# Patient Record
Sex: Female | Born: 1935 | Race: White | Hispanic: No | Smoking: Never smoker
Health system: Southern US, Community
[De-identification: ages and names within clinical notes are randomized; demographics above are authoritative.]

## PROBLEM LIST (undated history)

## (undated) DIAGNOSIS — R413 Other amnesia: Secondary | ICD-10-CM

## (undated) DIAGNOSIS — F419 Anxiety disorder, unspecified: Secondary | ICD-10-CM

## (undated) DIAGNOSIS — F0390 Unspecified dementia without behavioral disturbance: Secondary | ICD-10-CM

## (undated) DIAGNOSIS — E079 Disorder of thyroid, unspecified: Secondary | ICD-10-CM

## (undated) HISTORY — PX: TONSILLECTOMY: SUR1361

## (undated) HISTORY — DX: Unspecified dementia, unspecified severity, without behavioral disturbance, psychotic disturbance, mood disturbance, and anxiety: F03.90

## (undated) HISTORY — PX: URETHRAL SLING: SHX2621

## (undated) HISTORY — DX: Anxiety disorder, unspecified: F41.9

## (undated) HISTORY — PX: ABDOMINAL HYSTERECTOMY: SHX81

## (undated) HISTORY — DX: Other amnesia: R41.3

## (undated) HISTORY — PX: FOOT SURGERY: SHX648

## (undated) HISTORY — PX: KNEE SURGERY: SHX244

## (undated) HISTORY — PX: APPENDECTOMY: SHX54

## (undated) HISTORY — PX: ANTERIOR AND POSTERIOR VAGINAL REPAIR: SUR5

---

## 2013-04-27 ENCOUNTER — Emergency Department (HOSPITAL_BASED_OUTPATIENT_CLINIC_OR_DEPARTMENT_OTHER)
Admission: EM | Admit: 2013-04-27 | Discharge: 2013-04-27 | Disposition: A | Discharge status: Home / Self Care | Payer: PRIVATE HEALTH INSURANCE | Attending: Emergency Medicine | Admitting: Emergency Medicine

## 2013-04-27 ENCOUNTER — Encounter (HOSPITAL_BASED_OUTPATIENT_CLINIC_OR_DEPARTMENT_OTHER): Payer: Self-pay | Admitting: Emergency Medicine

## 2013-04-27 DIAGNOSIS — Y929 Unspecified place or not applicable: Secondary | ICD-10-CM | POA: Insufficient documentation

## 2013-04-27 DIAGNOSIS — Z79899 Other long term (current) drug therapy: Secondary | ICD-10-CM | POA: Insufficient documentation

## 2013-04-27 DIAGNOSIS — E079 Disorder of thyroid, unspecified: Secondary | ICD-10-CM | POA: Insufficient documentation

## 2013-04-27 DIAGNOSIS — S01409A Unspecified open wound of unspecified cheek and temporomandibular area, initial encounter: Secondary | ICD-10-CM | POA: Insufficient documentation

## 2013-04-27 DIAGNOSIS — W1809XA Striking against other object with subsequent fall, initial encounter: Secondary | ICD-10-CM | POA: Insufficient documentation

## 2013-04-27 DIAGNOSIS — W010XXA Fall on same level from slipping, tripping and stumbling without subsequent striking against object, initial encounter: Secondary | ICD-10-CM | POA: Insufficient documentation

## 2013-04-27 DIAGNOSIS — IMO0002 Reserved for concepts with insufficient information to code with codable children: Secondary | ICD-10-CM | POA: Insufficient documentation

## 2013-04-27 DIAGNOSIS — S01412A Laceration without foreign body of left cheek and temporomandibular area, initial encounter: Secondary | ICD-10-CM

## 2013-04-27 DIAGNOSIS — Y939 Activity, unspecified: Secondary | ICD-10-CM | POA: Insufficient documentation

## 2013-04-27 HISTORY — DX: Disorder of thyroid, unspecified: E07.9

## 2013-04-27 NOTE — ED Notes (Signed)
Tripped Fell  Hit left cheek on wall  approx 1 inch lac  bleeding controlled  No loc

## 2013-04-27 NOTE — ED Notes (Signed)
Tripped and fell. Facial laceration to her left cheek.

## 2013-04-27 NOTE — ED Provider Notes (Signed)
CSN: [id number]     Arrival date & time 04/27/13  1758 History   None    Chief Complaint  Patient presents with  . Fall  . Facial Laceration   (Consider location/radiation/quality/duration/timing/severity/associated sxs/prior Treatment) Patient is a 80 y.o. female presenting with fall. The history is provided by the patient. No language interpreter was used.  Fall This is a new problem. The current episode started today. The problem occurs constantly. Pertinent negatives include no neck pain. Nothing aggravates the symptoms. She has tried nothing for the symptoms. The treatment provided no relief.  Pt reports she tripped and fell and hit her left cheek.  No loc.  No blood thinners.  Pt his cheek and left knee  Past Medical History  Diagnosis Date  . Thyroid disease    Past Surgical History  Procedure Laterality Date  . Abdominal hysterectomy    . Tonsillectomy     No family history on file. History  Substance Use Topics  . Smoking status: Never Smoker   . Smokeless tobacco: Not on file  . Alcohol Use: No   OB History   Grav Para Term Preterm Abortions TAB SAB Ect Mult Living                 Review of Systems  Musculoskeletal: Negative for neck pain.  All other systems reviewed and are negative.    Allergies  Review of patient's allergies indicates no known allergies.  Home Medications   Current Outpatient Rx  Name  Route  Sig  Dispense  Refill  . Levothyroxine Sodium (SYNTHROID PO)   Oral   Take by mouth.          BP 170/74  Pulse 70  Temp(Src) 98.2 F (36.8 C) (Oral)  Resp 18  Ht 5\' 8"  (1.727 m)  Wt 165 lb (74.844 kg)  BMI 25.09 kg/m2  SpO2 100% Physical Exam  Nursing note and vitals reviewed. Constitutional: She is oriented to person, place, and time. She appears well-developed and well-nourished.  HENT:  Head: Normocephalic.  Eyes: EOM are normal.  Neck: Normal range of motion.  Pulmonary/Chest: Effort normal.  Abdominal: She exhibits no  distension.  Musculoskeletal:  anrasion left knee superficial  Neurological: She is alert and oriented to person, place, and time.  Skin:  1.5 cm laceration left cheek  Psychiatric: She has a normal mood and affect.    ED Course  LACERATION REPAIR Date/Time: 04/28/2013 1:02 PM Performed by: Elson Areas Authorized by: Elson Areas Consent: Verbal consent not obtained. Risks and benefits: risks, benefits and alternatives were discussed Consent given by: patient Required items: required blood products, implants, devices, and special equipment available Patient identity confirmed: verbally with patient Body area: head/neck Laceration length: 1.5 cm Foreign bodies: no foreign bodies Tendon involvement: none Nerve involvement: none Vascular damage: no Anesthesia: local infiltration Local anesthetic: lidocaine 2% without epinephrine Preparation: Patient was prepped and draped in the usual sterile fashion. Irrigation solution: saline Amount of cleaning: standard Debridement: none Degree of undermining: none Skin closure: 6-0 nylon and 6-0 Prolene Number of sutures: 4 Technique: simple Approximation: loose Approximation difficulty: simple Patient tolerance: Patient tolerated the procedure well with no immediate complications.   (including critical care time) Labs Review Labs Reviewed - No data to display Imaging Review No results found.  EKG Interpretation   None       MDM   1. Laceration of left cheek, initial encounter    Suture removal in 5 days  Lonia SkinnerLeslie K Appleton CitySofia, PA-C 04/28/13 1304

## 2013-04-27 NOTE — Discharge Instructions (Signed)
Facial Laceration  A facial laceration is a cut on the face. Lacerations usually heal quickly, but they need special care to reduce scarring. It will take 1 to 2 years for the scar to lose its redness and to heal completely.  TREATMENT   Some facial lacerations may not require closure. Some lacerations may not be able to be closed due to an increased risk of infection. It is important to see your caregiver as soon as possible after an injury to minimize the risk of infection and to maximize the opportunity for successful closure.  If closure is appropriate, pain medicines may be given, if needed. The wound will be cleaned to help prevent infection. Your caregiver will use stitches (sutures), staples, wound glue (adhesive), or skin adhesive strips to repair the laceration. These tools bring the skin edges together to allow for faster healing and a better cosmetic outcome. However, all wounds will heal with a scar.   Once the wound has healed, scarring can be minimized by covering the wound with sunscreen during the day for 1 full year. Use a sunscreen with an SPF of at least 30. Sunscreen helps to reduce the pigment that will form in the scar. When applying sunscreen to a completely healed wound, massage the scar for a few minutes to help reduce the appearance of the scar. Use circular motions with your fingertips, on and around the scar. Do not massage a healing wound.  HOME CARE INSTRUCTIONS  For sutures:   Keep the wound clean and dry.   If you were given a bandage (dressing), you should change it at least once a day. Also change the dressing if it becomes wet or dirty, or as directed by your caregiver.   Wash the wound with soap and water 2 times a day. Rinse the wound off with water to remove all soap. Pat the wound dry with a clean towel.   After cleaning, apply a thin layer of the antibiotic ointment recommended by your caregiver. This will help prevent infection and keep the dressing from sticking.   You  may shower as usual after the first 24 hours. Do not soak the wound in water until the sutures are removed.   Only take over-the-counter or prescription medicines for pain, discomfort, or fever as directed by your caregiver.   Get your sutures removed as directed by your caregiver. With facial lacerations, sutures should usually be taken out after 4 to 5 days to avoid stitch marks.   Wait a few days after your sutures are removed before applying makeup.  For skin adhesive strips:   Keep the wound clean and dry.   Do not get the skin adhesive strips wet. You may bathe carefully, using caution to keep the wound dry.   If the wound gets wet, pat it dry with a clean towel.   Skin adhesive strips will fall off on their own. You may trim the strips as the wound heals. Do not remove skin adhesive strips that are still stuck to the wound. They will fall off in time.  For wound adhesive:   You may briefly wet your wound in the shower or bath. Do not soak or scrub the wound. Do not swim. Avoid periods of heavy perspiration until the skin adhesive has fallen off on its own. After showering or bathing, gently pat the wound dry with a clean towel.   Do not apply liquid medicine, cream medicine, ointment medicine, or makeup to your wound while the   wound, be careful not to apply tape directly over the skin adhesive. This may cause the adhesive to be pulled off before the wound is healed. °· Avoid prolonged exposure to sunlight or tanning lamps while the skin adhesive is in place. Exposure to ultraviolet light in the first year will darken the scar. °· The skin adhesive will usually remain in place for 5 to 10 days, then naturally fall off the skin. Do not pick at the adhesive film. °You may need a tetanus shot if: °· You cannot remember when you had your last tetanus shot. °· You have never had a tetanus  shot. °If you get a tetanus shot, your arm may swell, get red, and feel warm to the touch. This is common and not a problem. If you need a tetanus shot and you choose not to have one, there is a rare chance of getting tetanus. Sickness from tetanus can be serious. °SEEK IMMEDIATE MEDICAL CARE IF: °· You develop redness, pain, or swelling around the wound. °· There is yellowish-white fluid (pus) coming from the wound. °· You develop chills or a fever. °MAKE SURE YOU: °· Understand these instructions. °· Will watch your condition. °· Will get help right away if you are not doing well or get worse. °Document Released: 05/16/2004 Document Revised: 07/01/2011 Document Reviewed: 11/19/2012 °ExitCare® Patient Information ©2014 [employer]. ° °

## 2013-05-02 ENCOUNTER — Encounter (HOSPITAL_BASED_OUTPATIENT_CLINIC_OR_DEPARTMENT_OTHER): Payer: Self-pay | Admitting: Emergency Medicine

## 2013-05-02 ENCOUNTER — Emergency Department (HOSPITAL_BASED_OUTPATIENT_CLINIC_OR_DEPARTMENT_OTHER)
Admission: EM | Admit: 2013-05-02 | Discharge: 2013-05-02 | Disposition: A | Discharge status: Home / Self Care | Payer: PRIVATE HEALTH INSURANCE | Attending: Emergency Medicine | Admitting: Emergency Medicine

## 2013-05-02 DIAGNOSIS — Z4802 Encounter for removal of sutures: Secondary | ICD-10-CM | POA: Insufficient documentation

## 2013-05-02 DIAGNOSIS — E079 Disorder of thyroid, unspecified: Secondary | ICD-10-CM | POA: Insufficient documentation

## 2013-05-02 DIAGNOSIS — Z79899 Other long term (current) drug therapy: Secondary | ICD-10-CM | POA: Insufficient documentation

## 2013-05-02 NOTE — ED Notes (Signed)
Patient here for suture removal from left cheek, in place x 5 days

## 2013-05-02 NOTE — Discharge Instructions (Signed)
Suture Removal, Care After Refer to this sheet in the next few weeks. These instructions provide you with information on caring for yourself after your procedure. Your health care provider may also give you more specific instructions. Your treatment has been planned according to current medical practices, but problems sometimes occur. Call your health care provider if you have any problems or questions after your procedure. WHAT TO EXPECT AFTER THE PROCEDURE After your stitches (sutures) are removed, it is typical to have the following:  Some discomfort and swelling in the wound area.  Slight redness in the area. HOME CARE INSTRUCTIONS   If you have skin adhesive strips over the wound area, do not take the strips off. They will fall off on their own in a few days. If the strips remain in place after 14 days, you may remove them.  Change any bandages (dressings) at least once a day or as directed by your health care provider. If the bandage sticks, soak it off with warm, soapy water.  Apply cream or ointment only as directed by your health care provider. If using cream or ointment, wash the area with soap and water 2 times a day to remove all the cream or ointment. Rinse off the soap and pat the area dry with a clean towel.  Keep the wound area dry and clean. If the bandage becomes wet or dirty, or if it develops a bad smell, change it as soon as possible.  Continue to protect the wound from injury.  Use sunscreen when out in the sun. New scars become sunburned easily. SEEK MEDICAL CARE IF:  You have increasing redness, swelling, or pain in the wound.  You see pus coming from the wound.  You have a fever.  You notice a bad smell coming from the wound or dressing.  Your wound breaks open (edges not staying together). Document Released: 01/01/2001 Document Revised: 01/27/2013 Document Reviewed: 11/18/2012 ExitCare Patient Information 2014 [employer].  

## 2013-05-02 NOTE — ED Provider Notes (Signed)
CSN: [id number]     Arrival date & time 05/02/13  1513 History   First MD Initiated Contact with Patient 05/02/13 1603     Chief Complaint  Patient presents with  . Suture / Staple Removal   (Consider location/radiation/quality/duration/timing/severity/associated sxs/prior Treatment) HPI Here for a planned suture removal left cheek no problems no redness no pus no significant pain no fever. Past Medical History  Diagnosis Date  . Thyroid disease    Past Surgical History  Procedure Laterality Date  . Abdominal hysterectomy    . Tonsillectomy     No family history on file. History  Substance Use Topics  . Smoking status: Never Smoker   . Smokeless tobacco: Not on file  . Alcohol Use: No   OB History   Grav Para Term Preterm Abortions TAB SAB Ect Mult Living                 Review of Systems See HPI. Allergies  Review of patient's allergies indicates no known allergies.  Home Medications   Current Outpatient Rx  Name  Route  Sig  Dispense  Refill  . Levothyroxine Sodium (SYNTHROID PO)   Oral   Take by mouth.          BP 144/65  Pulse 64  Temp(Src) 98 F (36.7 C)  Resp 16  SpO2 100% Physical Exam Awake alert normal speech vital signs reviewed Cervical spine nontender Left cheek wound is healed well with no erythema no fluctuance no significant tenderness no dehiscence no purulent drainage ED Course  Procedures (including critical care time) Labs Review Labs Reviewed - No data to display Imaging Review No results found.  EKG Interpretation   None       MDM   1. Visit for suture removal        Hurman HornJohn M Dakotah Orrego, MD 05/03/13 2141

## 2013-05-08 NOTE — ED Provider Notes (Signed)
Medical screening examination/treatment/procedure(s) were performed by non-physician practitioner and as supervising physician I was immediately available for consultation/collaboration.  EKG Interpretation   None         Ignace Mandigo, MD 05/08/13 1103 

## 2018-03-03 ENCOUNTER — Non-Acute Institutional Stay (SKILLED_NURSING_FACILITY): Payer: Medicare Other | Admitting: Internal Medicine

## 2018-03-03 ENCOUNTER — Encounter: Payer: Self-pay | Admitting: Internal Medicine

## 2018-03-03 DIAGNOSIS — Z8673 Personal history of transient ischemic attack (TIA), and cerebral infarction without residual deficits: Secondary | ICD-10-CM

## 2018-03-03 DIAGNOSIS — F0281 Dementia in other diseases classified elsewhere with behavioral disturbance: Secondary | ICD-10-CM

## 2018-03-03 DIAGNOSIS — E034 Atrophy of thyroid (acquired): Secondary | ICD-10-CM | POA: Diagnosis not present

## 2018-03-03 DIAGNOSIS — I10 Essential (primary) hypertension: Secondary | ICD-10-CM | POA: Diagnosis not present

## 2018-03-03 DIAGNOSIS — G301 Alzheimer's disease with late onset: Secondary | ICD-10-CM | POA: Diagnosis not present

## 2018-03-03 DIAGNOSIS — L89893 Pressure ulcer of other site, stage 3: Secondary | ICD-10-CM

## 2018-03-03 DIAGNOSIS — L89890 Pressure ulcer of other site, unstageable: Secondary | ICD-10-CM

## 2018-03-03 DIAGNOSIS — M6282 Rhabdomyolysis: Secondary | ICD-10-CM

## 2018-03-03 NOTE — Progress Notes (Signed)
:  [facility] Room Number: [phone] Place of Service:  SNF (31)  Randon Goldsmithnne D. Lyn HollingsheadAlexander, MD  Extended Emergency Contact Information Primary Emergency Contact: Carter,Sarah Address: [address]          [place], [place] [id number] [place] Home Phone: [phone] Relation: Daughter     Allergies: Nitrofurantoin  Chief Complaint  Patient presents with  . New Admit To SNF    [facility]    HPI: Patient is 84 y.o. female with dementia, hypothyroidism, anxiety, who lives alone with daughter living next door.  For a week prior to admission she had not been very active sitting mostly in her recliner feeling weak and tired.  Evening of admission patient noted that her recliner was wet with her with urine and mother was somewhat disoriented.  EMS was called, gave her my 5 mg of Haldol IM and she was brought to the ED.  In the ED she was noted to have a low-grade fever 100.3 was hemodynamically stable routine lab work unremarkable except for mild troponin elevation mild leukocytosis and CK significantly elevated at 1966, UA negative for UTI chest x-ray with cardiomegaly and no acute.  CT of head showed possible subacute infarct in the left occipital region, chronic atrophy and small vessel ischemic changes.  Patient received aspirin, IV fluids.  Patient was admitted to [facility] from 11/5[facility]11 for rhabdomyolysis which was treated with IV fluids in which resolved.  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  Also noted right fifth metatarsal head pressure injury unstable and left fifth metatarsal head pressure injury stage III.  Wound care was consulted.  Patient was also noted to have hypertension and blood pressure was high so patient was started on low-dose Norvasc.  Patient is admitted to skilled nursing facility for OT/PT.  While at skilled nursing facility patient  will be followed for hypothyroidism treated with Synthroid, history of CVA treated with aspirin and glaucoma treated with Lumigan drops.  Past Medical History:  Diagnosis Date  . Anxiety   . Dementia (HCC)   . Memory loss   . Thyroid disease     Past Surgical History:  Procedure Laterality Date  . ABDOMINAL HYSTERECTOMY    . ABDOMINAL HYSTERECTOMY    . ANTERIOR AND POSTERIOR VAGINAL REPAIR    . APPENDECTOMY    . FOOT SURGERY    . KNEE SURGERY    . TONSILLECTOMY    . URETHRAL SLING      Allergies as of 03/03/2018      Reactions   Nitrofurantoin       Medication List        Accurate as of 03/03/18 12:53 PM. Always use your most recent med list.          ALPRAZolam 0.25 MG tablet Commonly known as:  XANAX Take 0.25 mg by mouth 2 (two) times daily as needed for anxiety.   amLODipine 2.5 MG tablet Commonly known as:  NORVASC Take 2.5 mg by mouth daily.   aspirin 325 MG tablet Take 325 mg by mouth daily.   bimatoprost 0.01 % Soln Commonly known as:  LUMIGAN Place 1 drop into both eyes at bedtime.   donepezil 10 MG tablet Commonly known as:  ARICEPT Take 10 mg by mouth at bedtime.   QUEtiapine 50 MG tablet Commonly known as:  SEROQUEL Take 50 mg by mouth  at bedtime.   SYNTHROID PO Take 50 mcg by mouth. Take 1 tablet (50 mcg total) by moth daily at 0600       No orders of the defined types were placed in this encounter.    There is no immunization history on file for this patient.  Social History   Tobacco Use  . Smoking status: Never Smoker  Substance Use Topics  . Alcohol use: No    Family history is   Family History  Problem Relation Age of Onset  . Hypertension Mother   . Cancer Mother   . Stroke Father   . Hypertension Father       Review of Systems  DATA OBTAINED: from patient, nurse GENERAL:  no fevers, fatigue, appetite changes SKIN: No itching, or rash EYES: No eye pain, redness, discharge EARS: No earache, tinnitus,  change in hearing NOSE: No congestion, drainage or bleeding  MOUTH/THROAT: No mouth or tooth pain, No sore throat RESPIRATORY: No cough, wheezing, SOB CARDIAC: No chest pain, palpitations, lower extremity edema  GI: No abdominal pain, No N/V/D or constipation, No heartburn or reflux  GU: No dysuria, frequency or urgency, or incontinence  MUSCULOSKELETAL: No unrelieved bone/joint pain NEUROLOGIC: No headache, dizziness or focal weakness PSYCHIATRIC: No c/o anxiety or sadness   Vitals:   03/03/18 1249  BP: (!) 146/70  Pulse: 79  Resp: 18  Temp: 98.1 F (36.7 C)    SpO2 Readings from Last 1 Encounters:  05/02/13 100%   Body mass index is 23.14 kg/m.     Physical Exam  GENERAL APPEARANCE: Alert, conversant,  No acute distress.  SKIN: No diaphoresis rash HEAD: Normocephalic, atraumatic  EYES: Conjunctiva/lids clear. Pupils round, reactive. EOMs intact.  EARS: External exam WNL, canals clear. Hearing grossly normal.  NOSE: No deformity or discharge.  MOUTH/THROAT: Lips w/o lesions  RESPIRATORY: Breathing is even, unlabored. Lung sounds are clear   CARDIOVASCULAR: Heart RRR no murmurs, rubs or gallops. No peripheral edema.   GASTROINTESTINAL: Abdomen is soft, non-tender, not distended w/ normal bowel sounds. GENITOURINARY: Bladder non tender, not distended  MUSCULOSKELETAL: No abnormal joints or musculature NEUROLOGIC:  Cranial nerves 2-12 grossly intact. Moves all extremities  PSYCHIATRIC: Mood and affect appropriate with dementia, no behavioral issues at this time  There are no active problems to display for this patient.     Labs reviewed: Basic Metabolic Panel: No results found for: NA, K, CL, CO2, GLUCOSE, BUN, CREATININE, CALCIUM, PROT, ALBUMIN, AST, ALT, ALKPHOS, BILITOT, GFRNONAA, GFRAA  No results for input(s): NA, K, CL, CO2, GLUCOSE, BUN, CREATININE, CALCIUM, MG, PHOS in the last 8760 hours. Liver Function Tests: No results for input(s): AST, ALT, ALKPHOS,  BILITOT, PROT, ALBUMIN in the last 8760 hours. No results for input(s): LIPASE, AMYLASE in the last 8760 hours. No results for input(s): AMMONIA in the last 8760 hours. CBC: No results for input(s): WBC, NEUTROABS, HGB, HCT, MCV, PLT in the last 8760 hours. Lipid No results for input(s): CHOL, HDL, LDLCALC, TRIG in the last 8760 hours.  Cardiac Enzymes: No results for input(s): CKTOTAL, CKMB, CKMBINDEX, TROPONINI in the last 8760 hours. BNP: No results for input(s): BNP in the last 8760 hours. No results found for: MICROALBUR No results found for: HGBA1C No results found for: TSH No results found for: VITAMINB12 No results found for: FOLATE No results found for: IRON, TIBC, FERRITIN  Imaging and Procedures obtained prior to SNF admission: No results found.   Not all labs, radiology exams or other studies  done during hospitalization come through on my EPIC note; however they are reviewed by me.    Assessment and Plan  Rhabdomyolysis- CPK 1966; treated with IV fluids and resolved SNF- admitted for OT/PT  Dementia with psychosis SNF-appears stable; continue Aricept10 mg daily and Seroquel 50 mg nightly  Hypertension-was elevated patient was started on Norvasc 2.5 mg daily SNF- controlled for age, reasonably well controlled for age not a factor; will increase to 5 mg daily  Hypothyroidism-TSH normal SNF- continue Synthroid 50 mcg daily  History of CVA SNF- continue ASA 325 mg daily  Pressure injury right fifth metatarsal unstageable/pressure injury left fifth metatarsal stage III SNF- wound care to follow   Time spent 45 minutes;> 50% of time with patient was spent reviewing records, labs, tests and studies, counseling and developing plan of care  Thurston Hole D. Lyn Hollingshead, MD

## 2018-03-06 ENCOUNTER — Encounter: Payer: Self-pay | Admitting: Internal Medicine

## 2018-03-06 DIAGNOSIS — L89890 Pressure ulcer of other site, unstageable: Secondary | ICD-10-CM | POA: Insufficient documentation

## 2018-03-06 DIAGNOSIS — I10 Essential (primary) hypertension: Secondary | ICD-10-CM | POA: Insufficient documentation

## 2018-03-06 DIAGNOSIS — E039 Hypothyroidism, unspecified: Secondary | ICD-10-CM | POA: Insufficient documentation

## 2018-03-06 DIAGNOSIS — M6282 Rhabdomyolysis: Secondary | ICD-10-CM | POA: Insufficient documentation

## 2018-03-06 DIAGNOSIS — Z8673 Personal history of transient ischemic attack (TIA), and cerebral infarction without residual deficits: Secondary | ICD-10-CM | POA: Insufficient documentation

## 2018-03-06 DIAGNOSIS — F0390 Unspecified dementia without behavioral disturbance: Secondary | ICD-10-CM | POA: Insufficient documentation

## 2018-03-06 DIAGNOSIS — L89893 Pressure ulcer of other site, stage 3: Secondary | ICD-10-CM | POA: Insufficient documentation

## 2018-03-09 LAB — CBC AND DIFFERENTIAL
HEMATOCRIT: 33 — AB (ref 36–46)
HEMOGLOBIN: 10.9 — AB (ref 12.0–16.0)
Platelets: 334 (ref 150–399)
WBC: 4.5

## 2018-03-09 LAB — BASIC METABOLIC PANEL
BUN: 22 — AB (ref 4–21)
Creatinine: 0.9 (ref 0.5–1.1)
Glucose: 115
POTASSIUM: 4.1 (ref 3.4–5.3)
Sodium: 139 (ref 137–147)

## 2018-03-23 ENCOUNTER — Other Ambulatory Visit: Payer: Self-pay | Admitting: Internal Medicine

## 2018-03-23 ENCOUNTER — Non-Acute Institutional Stay (SKILLED_NURSING_FACILITY): Payer: Medicare Other | Admitting: Internal Medicine

## 2018-03-23 ENCOUNTER — Encounter: Payer: Self-pay | Admitting: Internal Medicine

## 2018-03-23 DIAGNOSIS — M6282 Rhabdomyolysis: Secondary | ICD-10-CM

## 2018-03-23 DIAGNOSIS — H409 Unspecified glaucoma: Secondary | ICD-10-CM

## 2018-03-23 DIAGNOSIS — I10 Essential (primary) hypertension: Secondary | ICD-10-CM

## 2018-03-23 DIAGNOSIS — Z8673 Personal history of transient ischemic attack (TIA), and cerebral infarction without residual deficits: Secondary | ICD-10-CM

## 2018-03-23 DIAGNOSIS — E034 Atrophy of thyroid (acquired): Secondary | ICD-10-CM

## 2018-03-23 DIAGNOSIS — F02818 Dementia in other diseases classified elsewhere, unspecified severity, with other behavioral disturbance: Secondary | ICD-10-CM

## 2018-03-23 DIAGNOSIS — G301 Alzheimer's disease with late onset: Secondary | ICD-10-CM | POA: Diagnosis not present

## 2018-03-23 DIAGNOSIS — F0281 Dementia in other diseases classified elsewhere with behavioral disturbance: Secondary | ICD-10-CM

## 2018-03-23 DIAGNOSIS — L89890 Pressure ulcer of other site, unstageable: Secondary | ICD-10-CM

## 2018-03-23 DIAGNOSIS — L89893 Pressure ulcer of other site, stage 3: Secondary | ICD-10-CM

## 2018-03-23 MED ORDER — QUETIAPINE FUMARATE 50 MG PO TABS: 50.0000 mg | ORAL_TABLET | Freq: Every day | ORAL | 0 refills | Status: AC

## 2018-03-23 MED ORDER — LEVOTHYROXINE SODIUM 50 MCG PO TABS
50.0000 ug | ORAL_TABLET | Freq: Every day | ORAL | 0 refills | Status: DC
Start: 2018-03-23 — End: 2018-11-16

## 2018-03-23 MED ORDER — BIMATOPROST 0.01 % OP SOLN: 1.0000 [drp] | Freq: Every day | OPHTHALMIC | 0 refills | Status: DC

## 2018-03-23 MED ORDER — DONEPEZIL HCL 10 MG PO TABS: 10.0000 mg | ORAL_TABLET | Freq: Every day | ORAL | 0 refills | Status: DC

## 2018-03-23 MED ORDER — ALPRAZOLAM 0.25 MG PO TABS: 0.2500 mg | ORAL_TABLET | Freq: Two times a day (BID) | ORAL | 0 refills | Status: DC

## 2018-03-23 MED ORDER — AMLODIPINE BESYLATE 2.5 MG PO TABS: 2.5000 mg | ORAL_TABLET | Freq: Every day | ORAL | 0 refills | Status: DC

## 2018-03-23 NOTE — Progress Notes (Signed)
Location:  [facility] Room Number: [phone] Place of Service:  SNF [phone])  Provider:  Margit Hanks MD   Extended Emergency Contact Information Primary Emergency Contact: Carter,Sarah Address: [address]          [place], [place] [address] [place] of [place] Home Phone: [phone] Mobile Phone: [phone] Relation: Daughter Secondary Emergency Contact: Quirino,Daniel Home Phone: [phone] Mobile Phone: [phone] Relation: Son  Allergies  Allergen Reactions  . Nitrofurantoin     Chief Complaint  Patient presents with  . Discharge Note    Discharge from [facility]    HPI:  84 y.o. female with dementia, hypothyroidism, anxiety who was brought to the emergency department the day of admission for urinary incontinence, disorientation.  Patient arrived per EMS who had given her 5 mg of Haldol IM.  In the ED she was noted to have low-grade fever and was hemodynamically stable with routine lab work unremarkable except for CK elevated at 1966.  UA is negative for UTI, chest x-ray with cardiomegaly and no acute.  CT of head showed possible subacute infarct of the left occipital region chronic small atrophy and small vessel ischemic changes.  Patient received aspirin IV fluids.  Patient was admitted to [facility] from 11/5-11 for rhabdomyolysis which was treated with IV fluids which resolved.  Patient received a diagnosis of dementia with psychotic behavior and was started on Aricept and Seroquel.  Also noted right fifth metatarsal head pressure injury and left fifth metatarsal head pressure injury stage III wound care was consulted.  Patient was also noted to have hypertension and blood pressure meds were started with low-dose Norvasc.  Patient was admitted to skilled nursing facility for OT/PT and is now ready to be discharged to [facility].    Past Medical History:  Diagnosis Date  . Anxiety   .  Dementia (HCC)   . Memory loss   . Thyroid disease     Past Surgical History:  Procedure Laterality Date  . ABDOMINAL HYSTERECTOMY    . ABDOMINAL HYSTERECTOMY    . ANTERIOR AND POSTERIOR VAGINAL REPAIR    . APPENDECTOMY    . FOOT SURGERY    . KNEE SURGERY    . TONSILLECTOMY    . URETHRAL SLING       reports that she has never smoked. She has never used smokeless tobacco. She reports that she does not drink alcohol or use drugs. Social History   Socioeconomic History  . Marital status: Widowed    Spouse name: Not on file  . Number of children: Not on file  . Years of education: Not on file  . Highest education level: Not on file  Occupational History  . Not on file  Social Needs  . Financial resource strain: Not on file  . Food insecurity:    Worry: Not on file    Inability: Not on file  . Transportation needs:    Medical: Not on file    Non-medical: Not on file  Tobacco Use  . Smoking status: Never Smoker  . Smokeless tobacco: Never Used  Substance and Sexual Activity  . Alcohol use: No  . Drug use: No  . Sexual activity: Not on file  Lifestyle  . Physical activity:    Days per week: Not on file    Minutes per session: Not on file  . Stress: Not on file  Relationships  . Social connections:  Talks on phone: Not on file    Gets together: Not on file    Attends religious service: Not on file    Active member of club or organization: Not on file    Attends meetings of clubs or organizations: Not on file    Relationship status: Not on file  . Intimate partner violence:    Fear of current or ex partner: Not on file    Emotionally abused: Not on file    Physically abused: Not on file    Forced sexual activity: Not on file  Other Topics Concern  . Not on file  Social History Narrative  . Not on file    Pertinent  Health Maintenance Due  Topic Date Due  . DEXA SCAN  01/15/1994  . PNA vac Low Risk Adult (1 of 2 - PCV13) 01/15/1994  . INFLUENZA VACCINE   11/20/2017    Medications: Allergies as of 03/23/2018      Reactions   Nitrofurantoin       Medication List       Accurate as of March 23, 2018 11:59 PM. Always use your most recent med list.        ALPRAZolam 0.25 MG tablet Commonly known as:  XANAX Take 1 tablet (0.25 mg total) by mouth 2 (two) times daily.   amLODipine 2.5 MG tablet Commonly known as:  NORVASC Take 1 tablet (2.5 mg total) by mouth daily.   aspirin 325 MG tablet Take 325 mg by mouth daily.   bimatoprost 0.01 % Soln Commonly known as:  LUMIGAN Place 1 drop into both eyes at bedtime.   donepezil 10 MG tablet Commonly known as:  ARICEPT Take 1 tablet (10 mg total) by mouth at bedtime.   levothyroxine 50 MCG tablet Commonly known as:  SYNTHROID Take 1 tablet (50 mcg total) by mouth daily before breakfast. Take 1 tablet (50 mcg total) by moth daily at 0600   MULTIVITAMINS/MINERALS ADULT PO Take 1 capsule by mouth daily.   QUEtiapine 50 MG tablet Commonly known as:  SEROQUEL Take 1 tablet (50 mg total) by mouth at bedtime.        Vitals:   03/23/18 1422  BP: 98/60  Pulse: 66  Resp: 18  Temp: (!) 97 F (36.1 C)  Weight: 154 lb (69.9 kg)  Height: 5\' 8"  (1.727 m)   Body mass index is 23.42 kg/m.  Physical Exam  GENERAL APPEARANCE: Alert, conversant. No acute distress.  HEENT: Unremarkable. RESPIRATORY: Breathing is even, unlabored. Lung sounds are clear   CARDIOVASCULAR: Heart RRR no murmurs, rubs or gallops. No peripheral edema.  GASTROINTESTINAL: Abdomen is soft, non-tender, not distended w/ normal bowel sounds.  NEUROLOGIC: Cranial nerves 2-12 grossly intact. Moves all extremities   Labs reviewed: Basic Metabolic Panel: Recent Labs    03/09/18  NA 139  K 4.1  BUN 22*  CREATININE 0.9   No results found for: [facility] Liver Function Tests: No results for input(s): AST, ALT, ALKPHOS, BILITOT, PROT, ALBUMIN in the last 8760 hours. No results for input(s): LIPASE, AMYLASE  in the last 8760 hours. No results for input(s): AMMONIA in the last 8760 hours. CBC: Recent Labs    03/09/18  WBC 4.5  HGB 10.9*  HCT 33*  PLT 334   Lipid No results for input(s): CHOL, HDL, LDLCALC, TRIG in the last 8760 hours. Cardiac Enzymes: No results for input(s): CKTOTAL, CKMB, CKMBINDEX, TROPONINI in the last 8760 hours. BNP: No results for input(s): BNP in the  last 8760 hours. CBG: No results for input(s): GLUCAP in the last 8760 hours.  Procedures and Imaging Studies During Stay: No results found.  Assessment/Plan:   Non-traumatic rhabdomyolysis  Late onset Alzheimer's disease with behavioral disturbance (HCC) - Plan: QUEtiapine (SEROQUEL) 50 MG tablet, donepezil (ARICEPT) 10 MG tablet, amLODipine (NORVASC) 2.5 MG tablet  Essential hypertension  Hypothyroidism due to acquired atrophy of thyroid - Plan: levothyroxine (SYNTHROID) 50 MCG tablet  History of cardioembolic cerebrovascular accident (CVA)  Pressure injury of toe of left foot, stage 3 (HCC) - Plan: Multiple Vitamins-Minerals (MULTIVITAMINS/MINERALS ADULT PO)  Pressure injury of toe of right foot, unstageable (HCC) - Plan: bimatoprost (LUMIGAN) 0.01 % SOLN, ALPRAZolam (XANAX) 0.25 MG tablet  Glaucoma, unspecified glaucoma type, unspecified laterality   Patient is being discharged with the following home health services:  OT/PT Nursing  Patient is being discharged with the following durable medical equipment:  Rolling walker, semi-electric bed  Patient has been advised to f/u with their PCP in 1-2 weeks to bring them up to date on their rehab stay.  Social services at facility was responsible for arranging this appointment.  Pt was provided with a 30 day supply of prescriptions for medications and refills must be obtained from their PCP.  For controlled substances, a more limited supply may be provided adequate until PCP appointment only.  Medications have been reconciled  Time spent . 30 min;> 50% of  time with patient was spent reviewing records, labs, tests and studies, counseling and developing plan of care  Merrilee SeashoreAnne Charli Liberatore, MD

## 2018-03-28 DIAGNOSIS — L89890 Pressure ulcer of other site, unstageable: Secondary | ICD-10-CM

## 2018-03-28 DIAGNOSIS — F0281 Dementia in other diseases classified elsewhere with behavioral disturbance: Secondary | ICD-10-CM

## 2018-03-28 DIAGNOSIS — G309 Alzheimer's disease, unspecified: Secondary | ICD-10-CM

## 2018-03-28 DIAGNOSIS — M6282 Rhabdomyolysis: Secondary | ICD-10-CM | POA: Diagnosis not present

## 2018-03-28 DIAGNOSIS — I119 Hypertensive heart disease without heart failure: Secondary | ICD-10-CM | POA: Diagnosis not present

## 2018-03-28 DIAGNOSIS — F419 Anxiety disorder, unspecified: Secondary | ICD-10-CM

## 2018-03-28 DIAGNOSIS — E039 Hypothyroidism, unspecified: Secondary | ICD-10-CM

## 2018-03-28 DIAGNOSIS — L89893 Pressure ulcer of other site, stage 3: Secondary | ICD-10-CM

## 2018-05-22 ENCOUNTER — Encounter (HOSPITAL_BASED_OUTPATIENT_CLINIC_OR_DEPARTMENT_OTHER): Payer: Self-pay | Attending: Internal Medicine

## 2018-11-13 ENCOUNTER — Emergency Department (HOSPITAL_COMMUNITY)
Admission: EM | Admit: 2018-11-13 | Discharge: 2018-11-13 | Disposition: A | Discharge status: Home / Self Care | Payer: Medicare Other | Attending: Emergency Medicine | Admitting: Emergency Medicine

## 2018-11-13 ENCOUNTER — Encounter (HOSPITAL_COMMUNITY): Payer: Self-pay | Admitting: Emergency Medicine

## 2018-11-13 ENCOUNTER — Emergency Department (HOSPITAL_COMMUNITY): Payer: Medicare Other

## 2018-11-13 ENCOUNTER — Other Ambulatory Visit: Payer: Self-pay

## 2018-11-13 DIAGNOSIS — I10 Essential (primary) hypertension: Secondary | ICD-10-CM | POA: Insufficient documentation

## 2018-11-13 DIAGNOSIS — E039 Hypothyroidism, unspecified: Secondary | ICD-10-CM | POA: Insufficient documentation

## 2018-11-13 DIAGNOSIS — U071 COVID-19: Secondary | ICD-10-CM | POA: Diagnosis not present

## 2018-11-13 DIAGNOSIS — Z79899 Other long term (current) drug therapy: Secondary | ICD-10-CM | POA: Diagnosis not present

## 2018-11-13 DIAGNOSIS — R0602 Shortness of breath: Secondary | ICD-10-CM | POA: Diagnosis present

## 2018-11-13 LAB — CBC WITH DIFFERENTIAL/PLATELET
Abs Immature Granulocytes: 0.01 10*3/uL (ref 0.00–0.07)
Basophils Absolute: 0 10*3/uL (ref 0.0–0.1)
Basophils Relative: 0 %
Eosinophils Absolute: 0 10*3/uL (ref 0.0–0.5)
Eosinophils Relative: 0 %
HCT: 37 % (ref 36.0–46.0)
Hemoglobin: 11.6 g/dL — ABNORMAL LOW (ref 12.0–15.0)
Immature Granulocytes: 0 %
Lymphocytes Relative: 46 %
Lymphs Abs: 1.5 10*3/uL (ref 0.7–4.0)
MCH: 30.1 pg (ref 26.0–34.0)
MCHC: 31.4 g/dL (ref 30.0–36.0)
MCV: 96.1 fL (ref 80.0–100.0)
Monocytes Absolute: 0.5 10*3/uL (ref 0.1–1.0)
Monocytes Relative: 14 %
Neutro Abs: 1.4 10*3/uL — ABNORMAL LOW (ref 1.7–7.7)
Neutrophils Relative %: 40 %
Platelets: 140 10*3/uL — ABNORMAL LOW (ref 150–400)
RBC: 3.85 MIL/uL — ABNORMAL LOW (ref 3.87–5.11)
RDW: 15 % (ref 11.5–15.5)
WBC: 3.6 10*3/uL — ABNORMAL LOW (ref 4.0–10.5)
nRBC: 0 % (ref 0.0–0.2)

## 2018-11-13 LAB — COMPREHENSIVE METABOLIC PANEL
ALT: 17 U/L (ref 0–44)
AST: 41 U/L (ref 15–41)
Albumin: 3.7 g/dL (ref 3.5–5.0)
Alkaline Phosphatase: 45 U/L (ref 38–126)
Anion gap: 11 (ref 5–15)
BUN: 23 mg/dL (ref 8–23)
CO2: 24 mmol/L (ref 22–32)
Calcium: 8.7 mg/dL — ABNORMAL LOW (ref 8.9–10.3)
Chloride: 101 mmol/L (ref 98–111)
Creatinine, Ser: 0.92 mg/dL (ref 0.44–1.00)
GFR calc Af Amer: >60 mL/min (ref >60)
GFR calc non Af Amer: 55 mL/min — ABNORMAL LOW (ref >60)
Glucose, Bld: 108 mg/dL — ABNORMAL HIGH (ref 70–99)
Potassium: 3.8 mmol/L (ref 3.5–5.1)
Sodium: 136 mmol/L (ref 135–145)
Total Bilirubin: 0.5 mg/dL (ref 0.3–1.2)
Total Protein: 6.9 g/dL (ref 6.5–8.1)

## 2018-11-13 LAB — URINALYSIS, ROUTINE W REFLEX MICROSCOPIC
Bilirubin Urine: NEGATIVE
Glucose, UA: NEGATIVE mg/dL
Ketones, ur: 5 mg/dL — AB
Nitrite: NEGATIVE
Protein, ur: 100 mg/dL — AB
Specific Gravity, Urine: 1.02 (ref 1.005–1.030)
pH: 5 (ref 5.0–8.0)

## 2018-11-13 LAB — LACTIC ACID, PLASMA: Lactic Acid, Venous: 1.3 mmol/L (ref 0.5–1.9)

## 2018-11-13 LAB — APTT: aPTT: 32 seconds (ref 24–36)

## 2018-11-13 LAB — PROTIME-INR
INR: 1.1 (ref 0.8–1.2)
Prothrombin Time: 14.3 seconds (ref 11.4–15.2)

## 2018-11-13 LAB — SARS CORONAVIRUS 2 BY RT PCR (HOSPITAL ORDER, PERFORMED IN ~~LOC~~ HOSPITAL LAB): SARS Coronavirus 2: POSITIVE — AB

## 2018-11-13 MED ORDER — ACETAMINOPHEN 500 MG PO TABS
1000.0000 mg | ORAL_TABLET | Freq: Once | ORAL | Status: AC
  Administered 2018-11-13: 1000 mg via ORAL
  Filled 2018-11-13: qty 2

## 2018-11-13 MED ORDER — SODIUM CHLORIDE 0.9 % IV SOLN
1000.0000 mL | INTRAVENOUS | Status: DC
  Administered 2018-11-13: 04:00:00 1000 mL via INTRAVENOUS

## 2018-11-13 NOTE — ED Notes (Signed)
Patient verbalizes understanding of discharge instructions. Opportunity for questioning and answers were provided. Armband removed by staff, pt discharged from ED with [facility]AR.  

## 2018-11-13 NOTE — ED Provider Notes (Addendum)
[facility] EM[facility]GENCY [facility] Provider Note  CSN: [id number] Arrival date & time: 11/13/18 [id number]  Chief Complaint(s) Shortness of Breath  BIB [facility] from [facility] with c/o of SOB, Generalized Weakness  and Fevers X 2 days. Pt is exposed to others with COVID at the nursing home. Pt has been tested but results have not come back yet.   HPI Whitney Schmidt is a 85 y.o. female  Here for SOB from sNF.   Remainder of history, ROS, and physical exam limited due to patient's condition (dementia). Additional information was obtained from EMS.   Level V Caveat.    HPI  Past Medical History Past Medical History:  Diagnosis Date  . Anxiety   . Dementia (HCC)   . Memory loss   . Thyroid disease    Patient Active Problem List   Diagnosis Date Noted  . Rhabdomyolysis 03/06/2018  . Dementia (HCC) 03/06/2018  . Hypertension 03/06/2018  . Hypothyroidism 03/06/2018  . History of cardioembolic cerebrovascular accident (CVA) 03/06/2018  . Pressure injury of toe, unstageable (HCC) 03/06/2018  . Pressure injury of toe, stage 3 (HCC) 03/06/2018   Home Medication(s) Prior to Admission medications   Medication Sig Start Date End Date Taking? Authorizing Provider  ALPRAZolam (XANAX) 0.25 MG tablet Take 1 tablet (0.25 mg total) by mouth 2 (two) times daily. 03/23/18   [name], Anne D, MD  amLODipine (NORVASC) 2.5 MG tablet Take 1 tablet (2.5 mg total) by mouth daily. 03/23/18   [name], Anne D, MD  aspirin 325 MG tablet Take 325 mg by mouth daily.    [provider]  bimatoprost (LUMIGAN) 0.01 % SOLN Place 1 drop into both eyes at bedtime. 03/23/18   [name], Anne D, MD  donepezil (ARICEPT) 10 MG tablet Take 1 tablet (10 mg total) by mouth at bedtime. 03/23/18   [name], Anne D, MD  levothyroxine (SYNTHROID) 50 MCG tablet Take 1 tablet (50 mcg total) by mouth daily before breakfast. Take 1 tablet (50 mcg total) by moth daily at 0600 03/23/18   [name], Anne D, MD   Multiple Vitamins-Minerals (MULTIVITAMINS/MINERALS ADULT PO) Take 1 capsule by mouth daily.    [provider]  QUEtiapine (SEROQUEL) 50 MG tablet Take 1 tablet (50 mg total) by mouth at bedtime. 03/23/18   [name], Anne D, MD                                                                                                                                    Past Surgical History Past Surgical History:  Procedure Laterality Date  . ABDOMINAL HYSTERECTOMY    . ABDOMINAL HYSTERECTOMY    . ANTERIOR AND POSTERIOR VAGINAL REPAIR    . APPENDECTOMY    . FOOT SURGERY    . KNEE SURGERY    . TONSILLECTOMY    . URETHRAL SLING     Family History Family History  Problem Relation Age of Onset  .  Hypertension Mother   . Cancer Mother   . Stroke Father   . Hypertension Father     Social History Social History   Tobacco Use  . Smoking status: Never Smoker  . Smokeless tobacco: Never Used  Substance Use Topics  . Alcohol use: No  . Drug use: No   Allergies Nitrofurantoin  Review of Systems Review of Systems  Unable to perform ROS: Dementia    Physical Exam Vital Signs  I have reviewed the triage vital signs BP (!) 119/49   Pulse 63   Temp (!) 101.8 F (38.8 C) (Rectal)   Resp 19   Ht 5\' 11"  (1.803 m)   Wt 69.9 kg   SpO2 96%   BMI 21.49 kg/m   Physical Exam Vitals signs reviewed.  Constitutional:      General: She is not in acute distress.    Appearance: She is well-developed. She is not diaphoretic.  HENT:     Head: Normocephalic and atraumatic.     Nose: Nose normal.  Eyes:     General: No scleral icterus.       Right eye: No discharge.        Left eye: No discharge.     Conjunctiva/sclera: Conjunctivae normal.     Pupils: Pupils are equal, round, and reactive to light.  Neck:     Musculoskeletal: Normal range of motion and neck supple.  Cardiovascular:     Rate and Rhythm: Normal rate and regular rhythm.     Heart sounds: No murmur. No friction rub.  No gallop.   Pulmonary:     Effort: Pulmonary effort is normal. No respiratory distress.     Breath sounds: Normal breath sounds. No stridor. No rales.  Abdominal:     General: There is no distension.     Palpations: Abdomen is soft.     Tenderness: There is no abdominal tenderness.  Musculoskeletal:        General: No tenderness.  Skin:    General: Skin is warm and dry.     Findings: No erythema or rash.  Neurological:     Mental Status: She is alert.     Comments: Oriented to person only     ED Results and Treatments Labs (all labs ordered are listed, but only abnormal results are displayed) Labs Reviewed  SARS CORONAVIRUS 2 ([facility]) - Abnormal; Notable for the following components:      Result Value   SARS Coronavirus 2 POSITIVE (*)    All other components within normal limits  COMPREHENSIVE METABOLIC PANEL - Abnormal; Notable for the following components:   Glucose, Bld 108 (*)    Calcium 8.7 (*)    GFR calc non Af Amer 55 (*)    All other components within normal limits  CBC WITH DIFFERENTIAL/PLATELET - Abnormal; Notable for the following components:   WBC 3.6 (*)    RBC 3.85 (*)    Hemoglobin 11.6 (*)    Platelets 140 (*)    Neutro Abs 1.4 (*)    All other components within normal limits  URINALYSIS, ROUTINE W REFLEX MICROSCOPIC - Abnormal; Notable for the following components:   Hgb urine dipstick MODERATE (*)    Ketones, ur 5 (*)    Protein, ur 100 (*)    Leukocytes,Ua TRACE (*)    Bacteria, UA RARE (*)    All other components within normal limits  CULTURE, BLOOD (ROUTINE X 2)  CULTURE, BLOOD (  ROUTINE X 2)  URINE CULTURE  LACTIC ACID, PLASMA  APTT  PROTIME-INR  LACTIC ACID, PLASMA                                                                                                                         EKG not in MUSE     Radiology Dg Chest Portable 1 View  Result Date: 11/13/2018 CLINICAL DATA:   85 year old female with shortness of breath. EXAM: PORTABLE CHEST 1 VIEW COMPARISON:  None. FINDINGS: Minimal left lung base atelectasis. No focal consolidation, pleural effusion, or pneumothorax. Mild cardiomegaly. Osteopenia. No acute osseous pathology. IMPRESSION: No active disease. Electronically Signed   By: Elgie CollardArash  Radparvar M.D.   On: 11/13/2018 03:15    Pertinent labs & imaging results that were available during my care of the patient were reviewed by me and considered in my medical decision making (see chart for details).  Medications Ordered in ED Medications  0.9 %  sodium chloride infusion (1,000 mLs Intravenous New Bag/Given 11/13/18 0340)  acetaminophen (TYLENOL) tablet 1,000 mg (1,000 mg Oral Given 11/13/18 [id number])                                                                                                                                    Procedures Procedures  (including critical care time)  Medical Decision Making / ED Course I have reviewed the nursing notes for this encounter and the patient's prior records (if available in EHR or on provided paperwork).   Whitney Schmidt was evaluated in Emergency Department on 11/13/2018 for the symptoms described in the history of present illness. She was evaluated in the context of the global COVID-19 pandemic, which necessitated consideration that the patient might be at risk for infection with the SARS-CoV-2 virus that causes COVID-19. Institutional protocols and algorithms that pertain to the evaluation of patients at risk for COVID-19 are in a state of rapid change based on information released by regulatory bodies including the CDC and federal and state organizations. These policies and algorithms were followed during the patient's care in the ED.  Patient is febrile.  Possible COVID exposure. Cover test obtained. Chest x-ray to rule out pneumonia was clear. Labs reassuring without leukocytosis or significant anemia. No significant  electrolyte derangement or renal insufficiency. UA without infection.  COVID test positive.  Patient is satting well on room air in no respiratory distress.  Stable for  discharge back to the facility.       Final Clinical Impression(s) / ED Diagnoses Final diagnoses:  COVID-19 virus infection     The patient appears reasonably screened and/or stabilized for discharge and I doubt any other medical condition or other [facility] requiring further screening, evaluation, or treatment in the ED at this time prior to discharge.  Disposition: Discharge  Condition: Good   Follow Up: [name], Anne D, MD [address] [place] [id number] [phone]   As needed     This chart was dictated using voice recognition software.  Despite best efforts to proofread,  errors can occur which can change the documentation meaning.     Nira Connardama, Lakeesha Fontanilla Eduardo, MD 11/13/18 [phone]

## 2018-11-13 NOTE — ED Triage Notes (Signed)
BIB [facility] from [facility] with c/o of SOB, Generalized Weakness  and Fevers X 2 days. Pt is exposed to others with COVID at the nursing home. Pt has been tested but results have not come back yet.

## 2018-11-13 NOTE — Discharge Instructions (Signed)
° ° °  Person Under Monitoring Name: Whitney Schmidt  Location: [place] [address]   CORONAVIRUS DISEASE 2019 (COVID-19) Guidance for Persons Under Investigation You are being tested for the virus that causes coronavirus disease 2019 (COVID-19). Public health actions are necessary to ensure protection of your health and the health of others, and to prevent further spread of infection. COVID-19 is caused by a virus that can cause symptoms, such as fever, cough, and shortness of breath. The primary transmission from person to person is by coughing or sneezing. On May 21, 2018, the [place] announced a [employer] Emergency of International Concern and on May 22, 2018 the U.S. Department of Health and Human Services declared a public health emergency. If the virus that causesCOVID-19 spreads in the community, it could have severe public health consequences.  As a person under investigation for COVID-19, the [place] advises you to adhere to the following guidance until your test results are reported to you. If your test result is positive, you will receive additional information from your provider and your local health department at that time.  Remain at home until you are cleared by your health provider or public health authorities.  Keep a log of visitors to your home using the form provided. Any visitors to your home must be aware of your isolation status. If you plan to move to a new address or leave the county, notify the local health department in your county. Call a doctor or seek care if you have an urgent medical need. Before seeking medical care, call ahead and get instructions from the provider before arriving at the medical office, clinic or hospital. Notify them that you are being tested for the virus that causes COVID-19 so arrangements can be made, as necessary, to prevent  transmission to others in the healthcare setting. Next, notify the local health department in your county. If a medical emergency arises and you need to call 911, inform the first responders that you are being tested for the virus that causes COVID-19. Next, notify the local health department in your county. Adhere to all guidance set forth by the [place] for [facility] of patients that is based on guidance from the Center for Disease Control and Prevention with suspected or confirmed COVID-19. It is provided with this guidance for Persons Under Investigation.  Your health and the health of our community are our top priorities. Public Health officials remain available to provide assistance and counseling to you about COVID-19 and compliance with this guidance.  Provider: ____________________________________________________________ Date: ______/_____/_________  By signing below, you acknowledge that you have read and agree to comply with this Guidance for Persons Under Investigation. ______________________________________________________________ Date: ______/_____/_________  WHO DO I CALL? You can find a list of local health departments here: [email or web address]/ Health Department: ____________________________________________________________________ Contact Name: ________________________________________________________________________ Telephone: ___________________________________________________________________________  Marice Potter, [place], Communicable Disease Branch COVID-19 Guidance for Persons Under Investigation June 27, 2018

## 2018-11-13 NOTE — ED Notes (Signed)
Contacted pt's son with updates on pt condition and plan of care.

## 2018-11-14 ENCOUNTER — Emergency Department (HOSPITAL_COMMUNITY)
Admission: EM | Admit: 2018-11-14 | Discharge: 2018-11-14 | Disposition: A | Discharge status: Home / Self Care | Payer: Medicare Other | Attending: Emergency Medicine | Admitting: Emergency Medicine

## 2018-11-14 ENCOUNTER — Other Ambulatory Visit: Payer: Self-pay

## 2018-11-14 DIAGNOSIS — E039 Hypothyroidism, unspecified: Secondary | ICD-10-CM | POA: Diagnosis not present

## 2018-11-14 DIAGNOSIS — I10 Essential (primary) hypertension: Secondary | ICD-10-CM | POA: Insufficient documentation

## 2018-11-14 DIAGNOSIS — Z79899 Other long term (current) drug therapy: Secondary | ICD-10-CM | POA: Insufficient documentation

## 2018-11-14 DIAGNOSIS — Z7982 Long term (current) use of aspirin: Secondary | ICD-10-CM | POA: Insufficient documentation

## 2018-11-14 DIAGNOSIS — E860 Dehydration: Secondary | ICD-10-CM | POA: Insufficient documentation

## 2018-11-14 DIAGNOSIS — U071 COVID-19: Secondary | ICD-10-CM

## 2018-11-14 DIAGNOSIS — F0390 Unspecified dementia without behavioral disturbance: Secondary | ICD-10-CM | POA: Insufficient documentation

## 2018-11-14 DIAGNOSIS — Z888 Allergy status to other drugs, medicaments and biological substances status: Secondary | ICD-10-CM | POA: Insufficient documentation

## 2018-11-14 LAB — URINE CULTURE: Culture: >=100000 — AB

## 2018-11-14 NOTE — Care Management (Signed)
ED CM attempted to contact son [place] by phone [phone] no answer, will make second attempt in 15 mins.

## 2018-11-14 NOTE — ED Notes (Signed)
Gave report to LaShon, RN at [facility]; informed her pt will be transported back to facility via [facility].

## 2018-11-14 NOTE — Discharge Instructions (Signed)
Patient's labs and x-ray results yesterday were normal without any evidence of UTI or pneumonia.  Patient's oxygen saturation today is 95% on room air with a normal blood pressure and temperature.  There is no criteria for admission today or further intervention.  It will be very common for patient to have fever over the next 4 to 7 days related to [place].  She needs to have temperature checks every 6-12 hours as well as spot pulse ox checks to ensure she is not becoming hypoxic.  She should be treated with Tylenol if she has a fever and encouraged to drink fluids to avoid dehydration.  She should be transferred back if she becomes hypoxic or has an acute change in her status.  All this information was discussed with her son Shanon Brow.

## 2018-11-14 NOTE — Care Management (Signed)
ED CM spoke with patient's sonNyoka Alcoser), he voiced concerns about patient being positive for covid- 19 and whether [facility] would have the staffing to accommodate her care.  He wanted to know why patient could not be admitted to [facility]. CM explained that currently patient does not meet the criteria for admission, he verbalized understanding but is very concerned about her returning. He states, that he has spoken with the Director of Nursing at [facility] today and voiced his concerns CM offered to contact the nursing to make them aware of her return today. CM called to updated  [facility] LPN on patient's return.  No further ED CM needs identified.

## 2018-11-14 NOTE — ED Notes (Signed)
Called ptar 

## 2018-11-14 NOTE — ED Triage Notes (Addendum)
Pt arrives with [facility] EMS from [place]. Pt is COVID positive; Facility reports fever of 103F today, facility gave 650 mg tylenol. Temp was 98.1 per EMS on arrival. Per EMS, son has requested pt go to [facility]. Pt has hx of dementia.   EMS vitals:   156/86 76 HR 153 CBG  95% RA

## 2018-11-14 NOTE — ED Notes (Signed)
ED Provider at bedside. 

## 2018-11-14 NOTE — ED Provider Notes (Signed)
[facility] EMERGENCY DEPARTMENT Provider Note   CSN: [id number] Arrival date & time: 11/14/18  1054     History   Chief Complaint Chief Complaint  Patient presents with  . COVID +    HPI Whitney Schmidt is a 85 y.o. female.     Patient is an 85 year old female with a history of thyroid disease and dementia who lives in a memory care unit presenting today for a recheck with known COVID.  Patient was seen yesterday after having 4 to 5 days of nasal congestion and fever.  Speaking with the patient's son she was having some worsening memory issues and a decreased appetite.  They were concerned for urinary tract infection which was initially why she was sent.  Lab work done approximately 24 hours ago showed a normal CBC, unchanged CMP and UA without significant findings for UTI.  Patient also had a chest x-ray that was clear.  Patient did test COVID positive and was sent back to the facility for quarantine.  Patient's son states that this morning the facility spoke with him and told him they were concerned that she was dehydrated and they would not be able to care for her and he agreed that she be sent back to the emergency room.    The history is provided by the EMS personnel and a relative. The history is limited by the condition of the patient.    Past Medical History:  Diagnosis Date  . Anxiety   . Dementia ([place])   . Memory loss   . Thyroid disease     Patient Active Problem List   Diagnosis Date Noted  . Rhabdomyolysis 03/06/2018  . Dementia ([place]) 03/06/2018  . Hypertension 03/06/2018  . Hypothyroidism 03/06/2018  . History of cardioembolic cerebrovascular accident (CVA) 03/06/2018  . Pressure injury of toe, unstageable ([place]) 03/06/2018  . Pressure injury of toe, stage 3 ([place]) 03/06/2018    Past Surgical History:  Procedure Laterality Date  . ABDOMINAL HYSTERECTOMY    . ABDOMINAL HYSTERECTOMY    . ANTERIOR AND POSTERIOR VAGINAL REPAIR    . APPENDECTOMY     . FOOT SURGERY    . KNEE SURGERY    . TONSILLECTOMY    . URETHRAL SLING       OB History   No obstetric history on file.      Home Medications    Prior to Admission medications   Medication Sig Start Date End Date Taking? Authorizing Provider  ALPRAZolam (XANAX) 0.25 MG tablet Take 1 tablet (0.25 mg total) by mouth 2 (two) times daily. 03/23/18   Hennie Duos, MD  amLODipine (NORVASC) 2.5 MG tablet Take 1 tablet (2.5 mg total) by mouth daily. 03/23/18   Hennie Duos, MD  aspirin 325 MG tablet Take 325 mg by mouth daily.    [provider]  bimatoprost (LUMIGAN) 0.01 % SOLN Place 1 drop into both eyes at bedtime. 03/23/18   Hennie Duos, MD  donepezil (ARICEPT) 10 MG tablet Take 1 tablet (10 mg total) by mouth at bedtime. 03/23/18   Hennie Duos, MD  levothyroxine (SYNTHROID) 50 MCG tablet Take 1 tablet (50 mcg total) by mouth daily before breakfast. Take 1 tablet (50 mcg total) by moth daily at 0600 03/23/18   Hennie Duos, MD  Multiple Vitamins-Minerals (MULTIVITAMINS/MINERALS ADULT PO) Take 1 capsule by mouth daily.    [provider]  QUEtiapine (SEROQUEL) 50 MG tablet Take 1 tablet (50 mg total) by  mouth at bedtime. 03/23/18   [name], Anne D, MD    Family History Family History  Problem Relation Age of Onset  . Hypertension Mother   . Cancer Mother   . Stroke Father   . Hypertension Father     Social History Social History   Tobacco Use  . Smoking status: Never Smoker  . Smokeless tobacco: Never Used  Substance Use Topics  . Alcohol use: No  . Drug use: No     Allergies   Nitrofurantoin   Review of Systems Review of Systems  Unable to perform ROS: Dementia     Physical Exam Updated Vital Signs BP (!) 130/55   Pulse 67   Temp 99.5 F (37.5 C) (Oral)   Resp (!) 23   SpO2 95%   Physical Exam Vitals signs and nursing note reviewed.  Constitutional:      General: She is not in acute distress.     Appearance: She is well-developed and normal weight.  HENT:     Head: Normocephalic and atraumatic.  Eyes:     Pupils: Pupils are equal, round, and reactive to light.  Cardiovascular:     Rate and Rhythm: Normal rate and regular rhythm.     Pulses: Normal pulses.     Heart sounds: Normal heart sounds. No murmur. No friction rub.  Pulmonary:     Effort: Pulmonary effort is normal. Tachypnea present.     Breath sounds: Normal breath sounds. No wheezing or rales.  Abdominal:     General: Bowel sounds are normal. There is no distension.     Palpations: Abdomen is soft.     Tenderness: There is no abdominal tenderness. There is no guarding or rebound.  Musculoskeletal: Normal range of motion.        General: No tenderness.     Comments: Trace edema in bilateral lower ext.  2+ DP pulses in bilateral feet.  Skin:    General: Skin is warm and dry.     Findings: No rash.  Neurological:     Mental Status: She is alert. Mental status is at baseline.     Cranial Nerves: No cranial nerve deficit.     Comments: Alert but not oriented to place or time  Psychiatric:     Comments: Calm and cooperative      ED Treatments / Results  Labs (all labs ordered are listed, but only abnormal results are displayed) Labs Reviewed - No data to display  EKG None  Radiology Dg Chest Portable 1 View  Result Date: 11/13/2018 CLINICAL DATA:  85 year old female with shortness of breath. EXAM: PORTABLE CHEST 1 VIEW COMPARISON:  None. FINDINGS: Minimal left lung base atelectasis. No focal consolidation, pleural effusion, or pneumothorax. Mild cardiomegaly. Osteopenia. No acute osseous pathology. IMPRESSION: No active disease. Electronically Signed   By: Elgie CollardArash  Radparvar M.D.   On: 11/13/2018 03:15    Procedures Procedures (including critical care time)  Medications Ordered in ED Medications - No data to display   Initial Impression / Assessment and Plan / ED Course  I have reviewed the triage vital  signs and the nursing notes.  Pertinent labs & imaging results that were available during my care of the patient were reviewed by me and considered in my medical decision making (see chart for details).        Elderly female presenting today as known COVID positive from [facility] wound memory care unit.  Upon arrival here patient is well-appearing with minimal tachypnea and  satting 95% on room air.  She is awake and alert and has no reproducible abdominal pain and reassuring vital signs.  Patient had labs approximately 24 hours ago that were reassuring and an x-ray negative for pneumonia.  Lung sounds are clear today.  Speaking with the patient's son he was told that she appeared very dehydrated and that they could not care for her at the facility.  Discussed with her son that her vital signs are normal today and her labs are reassuring yesterday.  Also discussed with him that she would need pulse ox checks as well as temperature checks as when her temperature is elevated she will have more confusion.  Son is on board with this plan and states he would ultimately like her transferred to another facility but understands at this time she will not be able to go anywhere else until her COVID illness has resolved.  Case management consulted for further help in that process however at this time feel that patient can be discharged back to her facility to continue quarantine and close monitoring.  Final Clinical Impressions(s) / ED Diagnoses   Final diagnoses:  COVID-19 virus infection    ED Discharge Orders    None       Gwyneth SproutPlunkett, Alton Bouknight, MD 11/14/18 1221

## 2018-11-15 ENCOUNTER — Telehealth: Payer: Self-pay | Admitting: *Deleted

## 2018-11-15 NOTE — Progress Notes (Signed)
ED Antimicrobial Stewardship Positive Culture Follow Up   Catrinia Racicot is an 85 y.o. female who presented to [facility] on 11/13/2018 with a chief complaint of  Chief Complaint  Patient presents with  . Shortness of Breath    Recent Results (from the past 720 hour(s))  Blood Culture (routine x 2)     Status: None (Preliminary result)   Collection Time: 11/13/18  3:05 AM   Specimen: BLOOD  Result Value Ref Range Status   Specimen Description BLOOD LEFT ANTECUBITAL  Final   Special Requests   Final    BOTTLES DRAWN AEROBIC AND ANAEROBIC Blood Culture results may not be optimal due to an inadequate volume of blood received in culture bottles   Culture   Final    NO GROWTH 2 DAYS Performed at [facility] [address] [place], [place] [address]    Report Status PENDING  Incomplete  Urine culture     Status: Abnormal   Collection Time: 11/13/18  3:20 AM   Specimen: Urine, Random  Result Value Ref Range Status   Specimen Description URINE, RANDOM  Final   Special Requests   Final    NONE Performed at [facility], [place] [address] [place], [place] [address]    Culture >=100,000 COLONIES/mL ESCHERICHIA COLI (A)  Final   Report Status 11/14/2018 FINAL  Final   Organism ID, Bacteria ESCHERICHIA COLI (A)  Final      Susceptibility   Escherichia coli - MIC*    AMPICILLIN >=32 RESISTANT Resistant     CEFAZOLIN <=4 SENSITIVE Sensitive     CEFTRIAXONE <=1 SENSITIVE Sensitive     CIPROFLOXACIN >=4 RESISTANT Resistant     GENTAMICIN >=16 RESISTANT Resistant     IMIPENEM <=0.25 SENSITIVE Sensitive     NITROFURANTOIN 128 RESISTANT Resistant     TRIMETH/SULFA <=20 SENSITIVE Sensitive     AMPICILLIN/SULBACTAM >=32 RESISTANT Resistant     PIP/TAZO 8 SENSITIVE Sensitive     Extended ESBL NEGATIVE Sensitive     * >=100,000 COLONIES/mL ESCHERICHIA COLI  SARS Coronavirus 2 (CEPHEID- Performed in [facility] hospital lab), Hosp Order     Status: Abnormal   Collection Time:  11/13/18  3:21 AM   Specimen: Nasopharyngeal Swab  Result Value Ref Range Status   SARS Coronavirus 2 POSITIVE (A) NEGATIVE Final    Comment: CRITICAL RESULT CALLED TO, READ BACK BY AND VERIFIED WITH: BEN BAILIFF RN @ 1607 ON 11/13/18 BY ROBINSON Z.  (NOTE) If result is NEGATIVE SARS-CoV-2 target nucleic acids are NOT DETECTED. The SARS-CoV-2 RNA is generally detectable in upper and lower  respiratory specimens during the acute phase of infection. The lowest  concentration of SARS-CoV-2 viral copies this assay can detect is 250  copies / mL. A negative result does not preclude SARS-CoV-2 infection  and should not be used as the sole basis for treatment or other  patient management decisions.  A negative result may occur with  improper specimen collection / handling, submission of specimen other  than nasopharyngeal swab, presence of viral mutation(s) within the  areas targeted by this assay, and inadequate number of viral copies  (<250 copies / mL). A negative result must be combined with clinical  observations, patient history, and epidemiological information. If result is POSITIVE SARS-CoV-2 target nucleic  acids are DETECTED. The SARS-CoV-2 RNA is generally detectable in upper and lower  respiratory specimens during the acute phase of infection.  Positive  results are indicative of active  infection with SARS-CoV-2.  Clinical  correlation with patient history and other diagnostic information is  necessary to determine patient infection status.  Positive results do  not rule out bacterial infection or co-infection with other viruses. If result is PRESUMPTIVE POSTIVE SARS-CoV-2 nucleic acids MAY BE PRESENT.   A presumptive positive result was obtained on the submitted specimen  and confirmed on repeat testing.  While 2019 novel coronavirus  (SARS-CoV-2) nucleic acids may be present in the submitted sample  additional confirmatory testing may be necessary for epidemiological  and /  or clinical management purposes  to differentiate between  SARS-CoV-2 and other Sarbecovirus currently known to infect humans.  If clinically indicated additional testing with an alternate test  me thodology [phone]([id number]) is advised. The SARS-CoV-2 RNA is generally  detectable in upper and lower respiratory specimens during the acute  phase of infection. The expected result is Negative. Fact Sheet for Patients:  BoilerBrush.com.cyhttps://www.fda.gov/media/136312/download Fact Sheet for Healthcare Providers: https://pope.com/https://www.fda.gov/media/136313/download This test is not yet approved or cleared by the [place] FDA and has been authorized for detection and/or diagnosis of SARS-CoV-2 by FDA under an Emergency Use Authorization (EUA).  This EUA will remain in effect (meaning this test can be used) for the duration of the COVID-19 declaration under Section 564(b)(1) of the Act, 21 U.S.C. section 360bbb-3(b)(1), unless the authorization is terminated or revoked sooner. Performed at [facility], [address] [place], [place] [address]     []  Treated with N/A, organism resistant to prescribed antimicrobial [x]  Patient discharged originally without antimicrobial agent and treatment is now indicated  New antibiotic prescription: Cephalexin 500mg  PO BID x 5 days  ED Provider: Buel ReamAlexandra Law, PA   Sammuel Blick, Drake LeachRachel Lynn 11/15/2018, 10:09 AM Clinical Pharmacist Monday - Friday phone -  [phone] Saturday - Sunday phone - [phone]

## 2018-11-15 NOTE — Telephone Encounter (Signed)
Post ED Visit - Positive Culture Follow-up: Successful Patient Follow-Up  Culture assessed and recommendations reviewed by:  []  Elenor Quinones, Pharm.D. []  Heide Guile, Pharm.D., BCPS AQ-ID []  Parks Neptune, Pharm.D., BCPS []  Alycia Rossetti, Pharm.D., BCPS []  [place], Pharm.D., BCPS, AAHIVP []  Legrand Como, Pharm.D., BCPS, AAHIVP [x]  Salome Arnt, PharmD, BCPS []  Johnnette Gourd, PharmD, BCPS []  Hughes Better, PharmD, BCPS []  Leeroy Cha, PharmD  Positive urine culture  [x]  Patient discharged without antimicrobial prescription and treatment is now indicated []  Organism is resistant to prescribed ED discharge antimicrobial []  Patient with positive blood cultures  Changes discussed with ED provider Renae Gloss, PA New antibiotic prescription Cephalexin 500mg  PO BID x 5 days Faxed to [facility] [employer]  (phone [phone])  (fax  [phone]) Kevan Ny Wood date 11/15/2018, time [place] 11/15/2018, 10:48 AM

## 2018-11-16 ENCOUNTER — Observation Stay (HOSPITAL_COMMUNITY)
Admission: EM | Admit: 2018-11-16 | Discharge: 2018-11-17 | Payer: Medicare Other | Attending: Internal Medicine | Admitting: Internal Medicine

## 2018-11-16 ENCOUNTER — Emergency Department (HOSPITAL_COMMUNITY): Payer: Medicare Other

## 2018-11-16 ENCOUNTER — Other Ambulatory Visit: Payer: Self-pay

## 2018-11-16 ENCOUNTER — Encounter (HOSPITAL_COMMUNITY): Payer: Self-pay | Admitting: Internal Medicine

## 2018-11-16 DIAGNOSIS — E860 Dehydration: Secondary | ICD-10-CM | POA: Diagnosis not present

## 2018-11-16 DIAGNOSIS — Z8673 Personal history of transient ischemic attack (TIA), and cerebral infarction without residual deficits: Secondary | ICD-10-CM

## 2018-11-16 DIAGNOSIS — R2689 Other abnormalities of gait and mobility: Secondary | ICD-10-CM | POA: Insufficient documentation

## 2018-11-16 DIAGNOSIS — Z8249 Family history of ischemic heart disease and other diseases of the circulatory system: Secondary | ICD-10-CM | POA: Insufficient documentation

## 2018-11-16 DIAGNOSIS — Z79890 Hormone replacement therapy (postmenopausal): Secondary | ICD-10-CM | POA: Diagnosis not present

## 2018-11-16 DIAGNOSIS — F0281 Dementia in other diseases classified elsewhere with behavioral disturbance: Secondary | ICD-10-CM

## 2018-11-16 DIAGNOSIS — N390 Urinary tract infection, site not specified: Secondary | ICD-10-CM | POA: Diagnosis not present

## 2018-11-16 DIAGNOSIS — F0390 Unspecified dementia without behavioral disturbance: Secondary | ICD-10-CM | POA: Insufficient documentation

## 2018-11-16 DIAGNOSIS — R531 Weakness: Secondary | ICD-10-CM

## 2018-11-16 DIAGNOSIS — Z881 Allergy status to other antibiotic agents status: Secondary | ICD-10-CM | POA: Diagnosis not present

## 2018-11-16 DIAGNOSIS — B9620 Unspecified Escherichia coli [E. coli] as the cause of diseases classified elsewhere: Secondary | ICD-10-CM | POA: Insufficient documentation

## 2018-11-16 DIAGNOSIS — Z7982 Long term (current) use of aspirin: Secondary | ICD-10-CM | POA: Insufficient documentation

## 2018-11-16 DIAGNOSIS — L89890 Pressure ulcer of other site, unstageable: Secondary | ICD-10-CM

## 2018-11-16 DIAGNOSIS — U071 COVID-19: Principal | ICD-10-CM | POA: Diagnosis present

## 2018-11-16 DIAGNOSIS — E039 Hypothyroidism, unspecified: Secondary | ICD-10-CM | POA: Diagnosis not present

## 2018-11-16 DIAGNOSIS — Z66 Do not resuscitate: Secondary | ICD-10-CM | POA: Diagnosis not present

## 2018-11-16 DIAGNOSIS — Z79899 Other long term (current) drug therapy: Secondary | ICD-10-CM | POA: Insufficient documentation

## 2018-11-16 DIAGNOSIS — E034 Atrophy of thyroid (acquired): Secondary | ICD-10-CM

## 2018-11-16 DIAGNOSIS — R2681 Unsteadiness on feet: Secondary | ICD-10-CM | POA: Diagnosis not present

## 2018-11-16 DIAGNOSIS — F419 Anxiety disorder, unspecified: Secondary | ICD-10-CM | POA: Insufficient documentation

## 2018-11-16 DIAGNOSIS — M6281 Muscle weakness (generalized): Secondary | ICD-10-CM | POA: Insufficient documentation

## 2018-11-16 DIAGNOSIS — M6282 Rhabdomyolysis: Secondary | ICD-10-CM | POA: Insufficient documentation

## 2018-11-16 DIAGNOSIS — I10 Essential (primary) hypertension: Secondary | ICD-10-CM | POA: Diagnosis not present

## 2018-11-16 DIAGNOSIS — F02818 Dementia in other diseases classified elsewhere, unspecified severity, with other behavioral disturbance: Secondary | ICD-10-CM

## 2018-11-16 LAB — CBC WITH DIFFERENTIAL/PLATELET
Abs Immature Granulocytes: 0.04 10*3/uL (ref 0.00–0.07)
Basophils Absolute: 0 10*3/uL (ref 0.0–0.1)
Basophils Relative: 0 %
Eosinophils Absolute: 0 10*3/uL (ref 0.0–0.5)
Eosinophils Relative: 0 %
HCT: 40.7 % (ref 36.0–46.0)
Hemoglobin: 12.8 g/dL (ref 12.0–15.0)
Immature Granulocytes: 1 %
Lymphocytes Relative: 50 %
Lymphs Abs: 2.2 10*3/uL (ref 0.7–4.0)
MCH: 30.3 pg (ref 26.0–34.0)
MCHC: 31.4 g/dL (ref 30.0–36.0)
MCV: 96.4 fL (ref 80.0–100.0)
Monocytes Absolute: 0.5 10*3/uL (ref 0.1–1.0)
Monocytes Relative: 10 %
Neutro Abs: 1.8 10*3/uL (ref 1.7–7.7)
Neutrophils Relative %: 39 %
Platelets: 152 10*3/uL (ref 150–400)
RBC: 4.22 MIL/uL (ref 3.87–5.11)
RDW: 15.1 % (ref 11.5–15.5)
WBC: 4.5 10*3/uL (ref 4.0–10.5)
nRBC: 0 % (ref 0.0–0.2)

## 2018-11-16 LAB — URINALYSIS, ROUTINE W REFLEX MICROSCOPIC
Bacteria, UA: NONE SEEN
Bilirubin Urine: NEGATIVE
Glucose, UA: NEGATIVE mg/dL
Ketones, ur: 5 mg/dL — AB
Nitrite: NEGATIVE
Protein, ur: 30 mg/dL — AB
Specific Gravity, Urine: 1.023 (ref 1.005–1.030)
pH: 5 (ref 5.0–8.0)

## 2018-11-16 LAB — COMPREHENSIVE METABOLIC PANEL
ALT: 23 U/L (ref 0–44)
AST: 62 U/L — ABNORMAL HIGH (ref 15–41)
Albumin: 3.7 g/dL (ref 3.5–5.0)
Alkaline Phosphatase: 42 U/L (ref 38–126)
Anion gap: 13 (ref 5–15)
BUN: 31 mg/dL — ABNORMAL HIGH (ref 8–23)
CO2: 24 mmol/L (ref 22–32)
Calcium: 8.5 mg/dL — ABNORMAL LOW (ref 8.9–10.3)
Chloride: 99 mmol/L (ref 98–111)
Creatinine, Ser: 0.93 mg/dL (ref 0.44–1.00)
GFR calc Af Amer: >60 mL/min (ref >60)
GFR calc non Af Amer: 54 mL/min — ABNORMAL LOW (ref >60)
Glucose, Bld: 103 mg/dL — ABNORMAL HIGH (ref 70–99)
Potassium: 3.5 mmol/L (ref 3.5–5.1)
Sodium: 136 mmol/L (ref 135–145)
Total Bilirubin: 0.4 mg/dL (ref 0.3–1.2)
Total Protein: 7.3 g/dL (ref 6.5–8.1)

## 2018-11-16 LAB — PROCALCITONIN: Procalcitonin: <0.1 ng/mL

## 2018-11-16 LAB — FIBRINOGEN: Fibrinogen: 527 mg/dL — ABNORMAL HIGH (ref 210–475)

## 2018-11-16 LAB — TRIGLYCERIDES: Triglycerides: 88 mg/dL (ref <150)

## 2018-11-16 LAB — C-REACTIVE PROTEIN: CRP: 6.8 mg/dL — ABNORMAL HIGH (ref <1.0)

## 2018-11-16 LAB — LACTATE DEHYDROGENASE: LDH: 238 U/L — ABNORMAL HIGH (ref 98–192)

## 2018-11-16 LAB — D-DIMER, QUANTITATIVE: D-Dimer, Quant: 1.28 ug/mL-FEU — ABNORMAL HIGH (ref 0.00–0.50)

## 2018-11-16 LAB — FERRITIN: Ferritin: 180 ng/mL (ref 11–307)

## 2018-11-16 LAB — LACTIC ACID, PLASMA: Lactic Acid, Venous: 1.2 mmol/L (ref 0.5–1.9)

## 2018-11-16 MED ORDER — SENNOSIDES-DOCUSATE SODIUM 8.6-50 MG PO TABS: 1.0000 | ORAL_TABLET | Freq: Every evening | ORAL | Status: DC | PRN

## 2018-11-16 MED ORDER — QUETIAPINE FUMARATE 25 MG PO TABS
50.0000 mg | ORAL_TABLET | Freq: Every day | ORAL | Status: DC
  Administered 2018-11-17: 02:00:00 50 mg via ORAL
  Filled 2018-11-16: qty 2

## 2018-11-16 MED ORDER — ADULT MULTIVITAMIN LIQUID CH
15.0000 mL | Freq: Every day | ORAL | Status: DC
  Administered 2018-11-17: 15 mL via ORAL
  Filled 2018-11-16: qty 15

## 2018-11-16 MED ORDER — METHYLPREDNISOLONE SODIUM SUCC 40 MG IJ SOLR
40.0000 mg | Freq: Two times a day (BID) | INTRAMUSCULAR | Status: DC
  Administered 2018-11-16 – 2018-11-17 (×3): 40 mg via INTRAVENOUS
  Filled 2018-11-16 (×3): qty 1

## 2018-11-16 MED ORDER — SODIUM CHLORIDE 0.9 % IV SOLN
1000.0000 mL | INTRAVENOUS | Status: DC
  Administered 2018-11-16: 16:00:00 1000 mL via INTRAVENOUS

## 2018-11-16 MED ORDER — ONDANSETRON HCL 4 MG/2ML IJ SOLN: 4.0000 mg | Freq: Four times a day (QID) | INTRAMUSCULAR | Status: DC | PRN

## 2018-11-16 MED ORDER — DONEPEZIL HCL 10 MG PO TABS
10.0000 mg | ORAL_TABLET | Freq: Every day | ORAL | Status: DC
  Administered 2018-11-17: 10 mg via ORAL
  Filled 2018-11-16: qty 1

## 2018-11-16 MED ORDER — LEVOTHYROXINE SODIUM 50 MCG PO TABS
50.0000 ug | ORAL_TABLET | Freq: Every day | ORAL | Status: DC
  Administered 2018-11-17: 50 ug via ORAL
  Filled 2018-11-16: qty 1

## 2018-11-16 MED ORDER — ASPIRIN EC 325 MG PO TBEC
325.0000 mg | DELAYED_RELEASE_TABLET | Freq: Every day | ORAL | Status: DC
  Administered 2018-11-17: 18:00:00 325 mg via ORAL
  Filled 2018-11-16 (×2): qty 1

## 2018-11-16 MED ORDER — LATANOPROST 0.005 % OP SOLN
1.0000 [drp] | Freq: Every day | OPHTHALMIC | Status: DC
  Filled 2018-11-16: qty 2.5

## 2018-11-16 MED ORDER — ENOXAPARIN SODIUM 40 MG/0.4ML ~~LOC~~ SOLN
40.0000 mg | Freq: Every day | SUBCUTANEOUS | Status: DC
  Administered 2018-11-17: 40 mg via SUBCUTANEOUS
  Filled 2018-11-16: qty 0.4

## 2018-11-16 MED ORDER — ALPRAZOLAM 0.25 MG PO TABS: 0.2500 mg | ORAL_TABLET | Freq: Two times a day (BID) | ORAL | Status: DC

## 2018-11-16 MED ORDER — SODIUM CHLORIDE 0.9 % IV SOLN
1.0000 g | INTRAVENOUS | Status: DC
  Administered 2018-11-16: 1 g via INTRAVENOUS
  Filled 2018-11-16: qty 10

## 2018-11-16 MED ORDER — ONDANSETRON HCL 4 MG PO TABS: 4.0000 mg | ORAL_TABLET | Freq: Four times a day (QID) | ORAL | Status: DC | PRN

## 2018-11-16 MED ORDER — ACETAMINOPHEN 325 MG PO TABS: 650.0000 mg | ORAL_TABLET | Freq: Four times a day (QID) | ORAL | Status: DC | PRN

## 2018-11-16 MED ORDER — SORBITOL 70 % SOLN
30.0000 mL | Freq: Every day | Status: DC | PRN
  Filled 2018-11-16: qty 30

## 2018-11-16 MED ORDER — AMLODIPINE BESYLATE 5 MG PO TABS
2.5000 mg | ORAL_TABLET | Freq: Every day | ORAL | Status: DC
  Administered 2018-11-17: 2.5 mg via ORAL
  Filled 2018-11-16: qty 1

## 2018-11-16 MED ORDER — TRAMADOL HCL 50 MG PO TABS: 50.0000 mg | ORAL_TABLET | Freq: Four times a day (QID) | ORAL | Status: DC | PRN

## 2018-11-16 MED ORDER — SODIUM CHLORIDE 0.9 % IV SOLN
INTRAVENOUS | Status: DC
  Administered 2018-11-16: 20:00:00 via INTRAVENOUS

## 2018-11-16 NOTE — ED Provider Notes (Signed)
[facility]-EMERGENCY DEPT Provider Note   CSN: [id number] Arrival date & time: 11/16/18  1439     History   Chief Complaint No chief complaint on file. CC weakness  HPI Whitney Schmidt is a 85 y.o. female.  She is brought in by ambulance from [facility].  Level 5 caveat secondary to dementia.  Per nursing staff she has a history of Covid and she is becoming weaker.  They did a chest x-ray today that showed pneumonia.  Patient herself denies any complaints but is unreliable.     The history is provided by the patient and the nursing home.  Weakness Severity:  Unable to specify Onset quality:  Gradual Progression:  Worsening Chronicity:  New Context: recent infection   Relieved by:  None tried Worsened by:  Nothing Ineffective treatments:  None tried Associated symptoms: no abdominal pain, no chest pain, no cough and no shortness of breath     Past Medical History:  Diagnosis Date  . Anxiety   . Dementia (HCC)   . Memory loss   . Thyroid disease     Patient Active Problem List   Diagnosis Date Noted  . Rhabdomyolysis 03/06/2018  . Dementia (HCC) 03/06/2018  . Hypertension 03/06/2018  . Hypothyroidism 03/06/2018  . History of cardioembolic cerebrovascular accident (CVA) 03/06/2018  . Pressure injury of toe, unstageable (HCC) 03/06/2018  . Pressure injury of toe, stage 3 (HCC) 03/06/2018    Past Surgical History:  Procedure Laterality Date  . ABDOMINAL HYSTERECTOMY    . ABDOMINAL HYSTERECTOMY    . ANTERIOR AND POSTERIOR VAGINAL REPAIR    . APPENDECTOMY    . FOOT SURGERY    . KNEE SURGERY    . TONSILLECTOMY    . URETHRAL SLING       OB History   No obstetric history on file.      Home Medications    Prior to Admission medications   Medication Sig Start Date End Date Taking? Authorizing Provider  ALPRAZolam (XANAX) 0.25 MG tablet Take 1 tablet (0.25 mg total) by mouth 2 (two) times daily. 03/23/18   [name], Anne D, MD  amLODipine  (NORVASC) 2.5 MG tablet Take 1 tablet (2.5 mg total) by mouth daily. 03/23/18   [name], Anne D, MD  aspirin 325 MG tablet Take 325 mg by mouth daily.    [provider]  bimatoprost (LUMIGAN) 0.01 % SOLN Place 1 drop into both eyes at bedtime. 03/23/18   [name], Anne D, MD  donepezil (ARICEPT) 10 MG tablet Take 1 tablet (10 mg total) by mouth at bedtime. 03/23/18   [name], Anne D, MD  levothyroxine (SYNTHROID) 50 MCG tablet Take 1 tablet (50 mcg total) by mouth daily before breakfast. Take 1 tablet (50 mcg total) by moth daily at 0600 03/23/18   [name], Anne D, MD  Multiple Vitamins-Minerals (MULTIVITAMINS/MINERALS ADULT PO) Take 1 capsule by mouth daily.    [provider]  QUEtiapine (SEROQUEL) 50 MG tablet Take 1 tablet (50 mg total) by mouth at bedtime. 03/23/18   [name], Anne D, MD    Family History Family History  Problem Relation Age of Onset  . Hypertension Mother   . Cancer Mother   . Stroke Father   . Hypertension Father     Social History Social History   Tobacco Use  . Smoking status: Never Smoker  . Smokeless tobacco: Never Used  Substance Use Topics  . Alcohol use: No  . Drug use: No  Allergies   Nitrofurantoin   Review of Systems Review of Systems  Unable to perform ROS: Dementia  Respiratory: Negative for cough and shortness of breath.   Cardiovascular: Negative for chest pain.  Gastrointestinal: Negative for abdominal pain.  Neurological: Positive for weakness.     Physical Exam Updated Vital Signs BP 127/67 (BP Location: Right Arm)   Pulse 63   Temp 98.7 F (37.1 C) (Oral)   Resp 20   SpO2 99%   Physical Exam Vitals signs and nursing note reviewed.  Constitutional:      General: She is not in acute distress.    Appearance: She is well-developed.  HENT:     Head: Normocephalic and atraumatic.  Eyes:     Conjunctiva/sclera: Conjunctivae normal.  Neck:     Musculoskeletal: Neck supple.  Cardiovascular:      Rate and Rhythm: Normal rate and regular rhythm.     Heart sounds: No murmur.  Pulmonary:     Effort: Pulmonary effort is normal. No respiratory distress.     Breath sounds: Normal breath sounds.  Abdominal:     Palpations: Abdomen is soft.     Tenderness: There is no abdominal tenderness.  Genitourinary:    Comments: She has some erythema around her peri-area. Musculoskeletal: Normal range of motion.        General: No signs of injury.  Skin:    General: Skin is warm and dry.     Capillary Refill: Capillary refill takes less than 2 seconds.  Neurological:     Mental Status: She is alert. Mental status is at baseline. She is disoriented.      ED Treatments / Results  Labs (all labs ordered are listed, but only abnormal results are displayed) Labs Reviewed  COMPREHENSIVE METABOLIC PANEL - Abnormal; Notable for the following components:      Result Value   Glucose, Bld 103 (*)    BUN 31 (*)    Calcium 8.5 (*)    AST 62 (*)    GFR calc non Af Amer 54 (*)    All other components within normal limits  D-DIMER, QUANTITATIVE (NOT AT [facility]) - Abnormal; Notable for the following components:   D-Dimer, Quant 1.28 (*)    All other components within normal limits  LACTATE DEHYDROGENASE - Abnormal; Notable for the following components:   LDH 238 (*)    All other components within normal limits  FIBRINOGEN - Abnormal; Notable for the following components:   Fibrinogen 527 (*)    All other components within normal limits  C-REACTIVE PROTEIN - Abnormal; Notable for the following components:   CRP 6.8 (*)    All other components within normal limits  URINALYSIS, ROUTINE W REFLEX MICROSCOPIC - Abnormal; Notable for the following components:   APPearance HAZY (*)    Hgb urine dipstick SMALL (*)    Ketones, ur 5 (*)    Protein, ur 30 (*)    Leukocytes,Ua SMALL (*)    All other components within normal limits  C-REACTIVE PROTEIN - Abnormal; Notable for the following components:   CRP  6.9 (*)    All other components within normal limits  CULTURE, BLOOD (ROUTINE X 2)  CULTURE, BLOOD (ROUTINE X 2)  URINE CULTURE  MRSA PCR SCREENING  LACTIC ACID, PLASMA  CBC WITH DIFFERENTIAL/PLATELET  PROCALCITONIN  FERRITIN  TRIGLYCERIDES  CBC WITH DIFFERENTIAL/PLATELET    EKG EKG Interpretation  Date/Time:  Monday November 16 2018 14:55:44 EDT Ventricular Rate:  61 PR Interval:  QRS Duration: 97 QT Interval:  426 QTC Calculation: 430 R Axis:   77 Text Interpretation:  Sinus rhythm Atrial premature complex compared with prior 7/20 Confirmed by Meridee ScoreButler, Khalis Hittle [id number]) on 11/16/2018 3:26:15 PM   Radiology Dg Chest Port 1 View  Result Date: 11/16/2018 CLINICAL DATA:  Increasing weakness.  COVID-19 infection. EXAM: PORTABLE CHEST 1 VIEW COMPARISON:  Radiographs 11/13/2018 and 03/06/2018. FINDINGS: 1519 hours. Two views obtained. The heart size and mediastinal contours are stable. There is mild aortic atherosclerosis. Patchy left-greater-than-right basilar pulmonary opacities are similar to the prior studies, likely due to scarring or chronic atelectasis. There is no new airspace disease, edema, significant pleural effusion or pneumothorax. The bones appear unchanged. Telemetry leads overlie the chest. IMPRESSION: Stable chronic bibasilar atelectasis or scarring. No acute findings identified. Electronically Signed   By: Carey BullocksWilliam  Veazey M.D.   On: 11/16/2018 16:22    Procedures Procedures (including critical care time)  Medications Ordered in ED Medications  methylPREDNISolone sodium succinate (SOLU-MEDROL) 40 mg/mL injection 40 mg (40 mg Intravenous Given 11/17/18 0614)  enoxaparin (LOVENOX) injection 40 mg (40 mg Subcutaneous Given 11/17/18 0137)  0.9 %  sodium chloride infusion ( Intravenous New Bag/Given 11/16/18 2002)  acetaminophen (TYLENOL) tablet 650 mg (has no administration in time range)  traMADol (ULTRAM) tablet 50 mg (has no administration in time range)  senna-docusate  (Senokot-S) tablet 1 tablet (has no administration in time range)  sorbitol 70 % solution 30 mL (has no administration in time range)  ondansetron (ZOFRAN) tablet 4 mg (has no administration in time range)    Or  ondansetron (ZOFRAN) injection 4 mg (has no administration in time range)  aspirin EC tablet 325 mg (has no administration in time range)  amLODipine (NORVASC) tablet 2.5 mg (2.5 mg Oral Given 11/17/18 1014)  ALPRAZolam (XANAX) tablet 0.25 mg (0.25 mg Oral Not Given 11/17/18 1000)  donepezil (ARICEPT) tablet 10 mg (10 mg Oral Given 11/17/18 0136)  QUEtiapine (SEROQUEL) tablet 50 mg (50 mg Oral Given 11/17/18 0136)  levothyroxine (SYNTHROID) tablet 50 mcg (50 mcg Oral Given 11/17/18 0617)  multivitamin liquid 15 mL (15 mLs Oral Given 11/17/18 1014)  latanoprost (XALATAN) 0.005 % ophthalmic solution 1 drop (1 drop Both Eyes Not Given 11/16/18 2345)  ondansetron (ZOFRAN) injection 4 mg (has no administration in time range)  polyvinyl alcohol (LIQUIFILM TEARS) 1.4 % ophthalmic solution 2 drop (has no administration in time range)     Initial Impression / Assessment and Plan / ED Course  I have reviewed the triage vital signs and the nursing notes.  Pertinent labs & imaging results that were available during my care of the patient were reviewed by me and considered in my medical decision making (see chart for details).  Clinical Course as of Nov 16 1248  Mon Nov 16, 2018  [id number] 85 year old female from nursing facility known Covid positive here with increased weakness and possible change in mental status per facility.  Patient is awake in no distress satting 98 to 100% on room air.  Differential includes Covid pneumonia, dehydration, UTI, metabolic derangement   [MB]  1801 I reviewed the case with Dr. Janee Mornhompson from the hospitalist service who recommends that I reach out to [facility] to get their recommendations if she would require admission.  Discussed with [facility] Thedore [facility]Singh [facility] who  states the patient should probably be [facility] on Solu-Medrol 40 mg twice daily and IV fluids as she probably would just keep bouncing back from the  nursing facility as there are unequipped to be able to care for her.   [MB]    Clinical Course User Index [MB] Hayden Rasmussen, MD   Shakema Surita was evaluated in Emergency Department on 11/16/2018 for the symptoms described in the history of present illness. She was evaluated in the context of the global COVID-19 pandemic, which necessitated consideration that the patient might be at risk for infection with the SARS-CoV-2 virus that causes COVID-19. Institutional protocols and algorithms that pertain to the evaluation of patients at risk for COVID-19 are in a state of rapid change based on information released by regulatory bodies including the CDC and federal and state organizations. These policies and algorithms were followed during the patient's care in the ED.      Final Clinical Impressions(s) / ED Diagnoses   Final diagnoses:  COVID-19 virus infection  Weakness    ED Discharge Orders    None       Hayden Rasmussen, MD 11/17/18 1251

## 2018-11-16 NOTE — ED Notes (Signed)
[facility] called for transport. 

## 2018-11-16 NOTE — ED Triage Notes (Signed)
Pt BIBA from [place] at [facility].   Per EMS- Covid positive pt, pt received Xray at facility today, showing signs of pneumonia.  \  AOx1 at baseline, hx of dementia.  Pt altered from baseline per facility staff.  Unable to describe further. "weaker than normal"

## 2018-11-16 NOTE — H&P (Signed)
History and Physical    Whitney BiddingDixie Schmidt ZOX:[id number] DOB: [birth year] DOA: 11/16/2018  PCP: [name], Anne D, MD  Patient coming from: [facility] nursing home  I have personally briefly reviewed patient's [facility]  Chief Complaint: Weakness  HPI: Whitney Schmidt is a 85 y.o. female with medical history significant of dementia, thyroid disease, hypertension, prior history of CVA who lives at a memory care unit at [facility] facility recently diagnosed with COVID on 11/13/2018 who is presenting to the ED for the third time from nursing facility with weakness.  Patient initially seen in the ED with a 4 to 5-day history of nasal congestion and fever, decreased appetite and initially sent to the ED for concerns for UTI.  Work-up done on initial presentation was positive for UTI.  Chest x-ray done at that time was clear.  COVID test done on initial presentation was positive however patient was not hypoxic and sent back to the facility for quarantine.  Patient presented back to the ED on 11/14/2018 for recheck and due to concerns for dehydration and inability to care for herself and patient subsequently sent to the ED.  Patient seen in the ED 11/14/2018 was satting 95% on room air exam at that time was reassuring repeat chest x-ray negative for pneumonia and sent back to the facility for quarantine and close monitoring. Patient sent back to the ED on day of admission from nursing facility stating that chest x-ray done at the facility was concerning for pneumonia and patient noted to be weaker than usual.  Patient denies any fevers, no chills, no nausea, no vomiting, no abdominal pain, no dysuria, no constipation, no diarrhea, no syncope, no melena, no hematemesis, no hematochezia, no cough.  Patient however with dementia and history unreliable.  History obtained from ED physician and from the chart.  ED Course: Patient seen in the ED noted to be satting 98% on room air, vital  signs were stable.  Comprehensive metabolic profile with a AST of 62 otherwise was unremarkable.  LDH was 238.  Ferritin was 180.  CRP was 6.8.  Procalcitonin was less than 0.1.  Lactic acid was 1.2.  CBC was unremarkable.  Chest x-ray was negative for any acute infiltrate.  Schmidt-dimer elevated at 1.28 with a fibrinogen of 527.  Urinalysis pending.  EKG done with normal sinus rhythm with no ischemic changes noted with a pulse of 61.  Initially discussed with the ED physician that patient was not hypoxic and did not meet requirements for admission.  ED physician discussed with [facility] and was felt that as patient had returned for the third time in 3-1/2 days, and was being sent back from the nursing home to the ED for the third time, that patient be admitted with close observation and placed on IV fluids for dehydration, IV antibiotics for UTI and close monitoring.  Review of Systems: As per HPI otherwise negative.  Past Medical History:  Diagnosis Date  . Anxiety   . Dementia (HCC)   . Memory loss   . Thyroid disease     Past Surgical History:  Procedure Laterality Date  . ABDOMINAL HYSTERECTOMY    . ABDOMINAL HYSTERECTOMY    . ANTERIOR AND POSTERIOR VAGINAL REPAIR    . APPENDECTOMY    . FOOT SURGERY    . KNEE SURGERY    . TONSILLECTOMY    . URETHRAL SLING       reports that she has never smoked. She  has never used smokeless tobacco. She reports that she does not drink alcohol or use drugs.  Allergies  Allergen Reactions  . Nitrofurantoin     Family History  Problem Relation Age of Onset  . Hypertension Mother   . Cancer Mother   . Stroke Father   . Hypertension Father    Family history reviewed and nonpertinent.  Prior to Admission medications   Medication Sig Start Date End Date Taking? Authorizing Provider  ALPRAZolam (XANAX) 0.25 MG tablet Take 1 tablet (0.25 mg total) by mouth 2 (two) times daily. 03/23/18   Hennie Duos, MD  amLODipine (NORVASC) 2.5 MG  tablet Take 1 tablet (2.5 mg total) by mouth daily. 03/23/18   Hennie Duos, MD  aspirin 325 MG tablet Take 325 mg by mouth daily.    [provider]  bimatoprost (LUMIGAN) 0.01 % SOLN Place 1 drop into both eyes at bedtime. 03/23/18   Hennie Duos, MD  donepezil (ARICEPT) 10 MG tablet Take 1 tablet (10 mg total) by mouth at bedtime. 03/23/18   Hennie Duos, MD  levothyroxine (SYNTHROID) 50 MCG tablet Take 1 tablet (50 mcg total) by mouth daily before breakfast. Take 1 tablet (50 mcg total) by moth daily at 0600 03/23/18   Hennie Duos, MD  Multiple Vitamins-Minerals (MULTIVITAMINS/MINERALS ADULT PO) Take 1 capsule by mouth daily.    [provider]  QUEtiapine (SEROQUEL) 50 MG tablet Take 1 tablet (50 mg total) by mouth at bedtime. 03/23/18   Hennie Duos, MD    Physical Exam: Vitals:   11/16/18 1700 11/16/18 1730 11/16/18 1800 11/16/18 1830  BP: (!) 131/46 (!) 140/58 (!) 138/47 (!) 127/54  Pulse: 69 69 65 (!) 59  Resp: (!) 22 20 (!) 23 (!) 24  Temp:      TempSrc:      SpO2: 98% 100% 98% 97%    Constitutional: NAD, calm, comfortable Vitals:   11/16/18 1700 11/16/18 1730 11/16/18 1800 11/16/18 1830  BP: (!) 131/46 (!) 140/58 (!) 138/47 (!) 127/54  Pulse: 69 69 65 (!) 59  Resp: (!) 22 20 (!) 23 (!) 24  Temp:      TempSrc:      SpO2: 98% 100% 98% 97%   Eyes: PERRL, lids and conjunctivae normal ENMT: Mucous membranes are dry. Posterior pharynx clear of any exudate or lesions.Normal dentition.  Neck: normal, supple, no masses, no thyromegaly Respiratory: clear to auscultation bilaterally, no wheezing, no crackles. Normal respiratory effort. No accessory muscle use.  Cardiovascular: Regular rate and rhythm, no murmurs / rubs / gallops. No extremity edema. 2+ pedal pulses. No carotid bruits.  Abdomen: no tenderness, no masses palpated. No hepatosplenomegaly. Bowel sounds positive.  Musculoskeletal: no clubbing / cyanosis. No joint deformity upper  and lower extremities. Good ROM, no contractures. Normal muscle tone.  Skin: no rashes, lesions, ulcers. No induration Neurologic: CN 2-12 grossly intact. Sensation intact, DTR normal. Strength 5/5 in all 4.  Psychiatric: Normal judgment and insight. Alert and oriented x 3. Normal mood.   Labs on Admission: I have personally reviewed following labs and imaging studies  CBC: Recent Labs  Lab 11/13/18 0341 11/16/18 1517  WBC 3.6* 4.5  NEUTROABS 1.4* 1.8  HGB 11.6* 12.8  HCT 37.0 40.7  MCV 96.1 96.4  PLT 140* 644   Basic Metabolic Panel: Recent Labs  Lab 11/13/18 0341 11/16/18 1517  NA 136 136  K 3.8 3.5  CL 101 99  CO2 24 24  GLUCOSE  108* 103*  BUN 23 31*  CREATININE 0.92 0.93  CALCIUM 8.7* 8.5*   GFR: Estimated Creatinine Clearance: 45.3 mL/min (by C-G formula based on SCr of 0.93 mg/dL). Liver Function Tests: Recent Labs  Lab 11/13/18 0341 11/16/18 1517  AST 41 62*  ALT 17 23  ALKPHOS 45 42  BILITOT 0.5 0.4  PROT 6.9 7.3  ALBUMIN 3.7 3.7   No results for input(s): LIPASE, AMYLASE in the last 168 hours. No results for input(s): AMMONIA in the last 168 hours. Coagulation Profile: Recent Labs  Lab 11/13/18 0341  INR 1.1   Cardiac Enzymes: No results for input(s): CKTOTAL, CKMB, CKMBINDEX, TROPONINI in the last 168 hours. BNP (last 3 results) No results for input(s): PROBNP in the last 8760 hours. HbA1C: No results for input(s): HGBA1C in the last 72 hours. CBG: No results for input(s): GLUCAP in the last 168 hours. Lipid Profile: Recent Labs    11/16/18 1517  TRIG 88   Thyroid Function Tests: No results for input(s): TSH, T4TOTAL, FREET4, T3FREE, THYROIDAB in the last 72 hours. Anemia Panel: Recent Labs    11/16/18 1517  FERRITIN 180   Urine analysis:    Component Value Date/Time   COLORURINE YELLOW 11/13/2018 0433   APPEARANCEUR CLEAR 11/13/2018 0433   LABSPEC 1.020 11/13/2018 0433   PHURINE 5.0 11/13/2018 0433   GLUCOSEU NEGATIVE  11/13/2018 0433   HGBUR MODERATE (A) 11/13/2018 0433   BILIRUBINUR NEGATIVE 11/13/2018 0433   KETONESUR 5 (A) 11/13/2018 0433   PROTEINUR 100 (A) 11/13/2018 0433   NITRITE NEGATIVE 11/13/2018 0433   LEUKOCYTESUR TRACE (A) 11/13/2018 0433    Radiological Exams on Admission: Dg Chest Port 1 View  Result Date: 11/16/2018 CLINICAL DATA:  Increasing weakness.  COVID-19 infection. EXAM: PORTABLE CHEST 1 VIEW COMPARISON:  Radiographs 11/13/2018 and 03/06/2018. FINDINGS: 1519 hours. Two views obtained. The heart size and mediastinal contours are stable. There is mild aortic atherosclerosis. Patchy left-greater-than-right basilar pulmonary opacities are similar to the prior studies, likely due to scarring or chronic atelectasis. There is no new airspace disease, edema, significant pleural effusion or pneumothorax. The bones appear unchanged. Telemetry leads overlie the chest. IMPRESSION: Stable chronic bibasilar atelectasis or scarring. No acute findings identified. Electronically Signed   By: Carey BullocksWilliam  Veazey M.Schmidt.   On: 11/16/2018 16:22    EKG: Independently reviewed.  Normal sinus rhythm.  No ischemic changes noted.  Pulse of 61.  Assessment/Plan Principal Problem:   COVID-19 virus infection Active Problems:   E. coli UTI   Dehydration   Rhabdomyolysis   Dementia (HCC)   Hypertension   Hypothyroidism   History of cardioembolic cerebrovascular accident (CVA)    1 COVID-19 infection Patient sent back to the ED from the nursing home for the third time since 11/13/2018 due to concerns for dehydration and chest x-ray done at the nursing facility which was supposedly positive for pneumonia.  Patient seen in the ED noted to have sats of 98% on room air.  Chest x-ray done was negative for any acute infiltrate.  Patient denied any respiratory symptoms however patient with dementia.  Bolick profile with a calcium of 8.5, AST of 62, BUN of 31 otherwise was unremarkable.  LDH was 238.  Ferritin of 180.   CRP of 6.8.  Procalcitonin less than 0.1.  CBC unremarkable.  Elevated at 1.28.  Fibrinogen of 527.  As patient has been sent for the third time to the ED patient [facility]  Will  place patient on Solu-Medrol 40 mg IV every 12 hours.  Placed on IV fluids.  Lovenox for DVT prophylaxis.  Placed empirically on IV Rocephin due to positive urinalysis with urine cultures of greater than 100,000 E. coli on urine cultures from 11/13/2018.  2.  E. coli UTI Urine cultures from 11/13/2018 with urine cultures are greater than 100,000 colonies of E. coli sensitive to Rocephin.  Repeat urinalysis pending.  Placed on IV Rocephin.  Follow.  3.  Dehydration IV fluids.  4.  Hypothyroidism Resume home regimen Synthroid.  5.  Hypertension Blood pressure stable.  Resume home regimen of Norvasc 2.5 mg daily.  6.  Dementia Stable.  Resume Seroquel nightly.  Continue home regimen Aricept.  7.  History of CVA Continue aspirin for secondary stroke prophylaxis.  DVT prophylaxis: Lovenox Code Status: DNR Family Communication: Updated son (POA), via telephone. Disposition Plan: Likely back to facility when stable. Consults called: None Admission status: Place in observation   Ramiro Harvestaniel Thompson MD [facility]  If 7PM-7AM, please contact night-coverage [email or web address]  11/16/2018, 6:57 PM

## 2018-11-16 NOTE — ED Notes (Signed)
[facility] arrived for transport 

## 2018-11-17 ENCOUNTER — Other Ambulatory Visit: Payer: Self-pay

## 2018-11-17 DIAGNOSIS — G301 Alzheimer's disease with late onset: Secondary | ICD-10-CM

## 2018-11-17 DIAGNOSIS — F0281 Dementia in other diseases classified elsewhere with behavioral disturbance: Secondary | ICD-10-CM

## 2018-11-17 DIAGNOSIS — R531 Weakness: Secondary | ICD-10-CM | POA: Diagnosis not present

## 2018-11-17 DIAGNOSIS — E034 Atrophy of thyroid (acquired): Secondary | ICD-10-CM | POA: Diagnosis not present

## 2018-11-17 DIAGNOSIS — U071 COVID-19: Secondary | ICD-10-CM | POA: Diagnosis not present

## 2018-11-17 LAB — C-REACTIVE PROTEIN: CRP: 6.9 mg/dL — ABNORMAL HIGH (ref <1.0)

## 2018-11-17 MED ORDER — DEXAMETHASONE 2 MG PO TABS: ORAL_TABLET | ORAL | 0 refills | Status: AC

## 2018-11-17 MED ORDER — LEVOTHYROXINE SODIUM 50 MCG PO TABS: 50.0000 ug | ORAL_TABLET | Freq: Every day | ORAL | 0 refills | Status: AC

## 2018-11-17 MED ORDER — ALPRAZOLAM 0.25 MG PO TABS: 0.2500 mg | ORAL_TABLET | Freq: Two times a day (BID) | ORAL | 0 refills | Status: DC | PRN

## 2018-11-17 MED ORDER — DONEPEZIL HCL 10 MG PO TABS: 10.0000 mg | ORAL_TABLET | Freq: Every day | ORAL | 0 refills | Status: AC

## 2018-11-17 MED ORDER — POLYVINYL ALCOHOL 1.4 % OP SOLN
2.0000 [drp] | OPHTHALMIC | Status: DC | PRN
  Filled 2018-11-17: qty 15

## 2018-11-17 NOTE — Care Plan (Signed)
Phoned son Cristie Mckinney for an update, no answer. Will attempt again later.

## 2018-11-17 NOTE — [place] FL2 (Addendum)
[place] MEDICAID FL2 LEVEL OF CARE SCREENING TOOL     IDENTIFICATION  Patient Name: Whitney Schmidt Birthdate: 11/01/1926 Sex: female Admission Date (Current Location): 11/16/2018  [facility] [place] [place]   Facility and Address:  [facility] [place], [address], [place], [place] [address]      Provider Number: [id number]  Attending Physician Name and Address:  Hollice EspyKrishnan, Sendil K, MD  Relative Name and Phone Number:  Onalee HuaDavid (son)[phone]    Current Level of Care: Hospital Recommended Level of Care: Assisted Living Facility Prior Approval Number:    Date Approved/Denied:   PASRR Number: [id number]  Discharge Plan: (ALF - [facility])    Current Diagnoses: Patient Active Problem List   Diagnosis Date Noted  . COVID-19 virus infection 11/16/2018  . Dehydration 11/16/2018  . Weakness   . Rhabdomyolysis 03/06/2018  . Dementia (HCC) 03/06/2018  . Hypertension 03/06/2018  . Hypothyroidism 03/06/2018  . History of cardioembolic cerebrovascular accident (CVA) 03/06/2018  . Pressure injury of toe, unstageable (HCC) 03/06/2018  . Pressure injury of toe, stage 3 (HCC) 03/06/2018    Orientation RESPIRATION BLADDER Height & Weight     Self  Normal Normal - Continent Weight:   Height:     BEHAVIORAL SYMPTOMS/MOOD NEUROLOGICAL BOWEL NUTRITION STATUS      Continent Diet: no added salt  AMBULATORY STATUS COMMUNICATION OF NEEDS Skin   Extensive Assist Verbally Normal                       Personal Care Assistance Level of Assistance  Bathing, Feeding, Dressing, Total care Bathing Assistance: Maximum assistance Feeding assistance: Limited assistance Dressing Assistance: Maximum assistance Total Care Assistance: Maximum assistance   Functional Limitations Info  Sight, Hearing, Speech Sight Info: Adequate Hearing Info: Adequate Speech Info: Adequate    SPECIAL CARE FACTORS FREQUENCY                        Contractures Contractures Info: Not present    Additional Factors Info  Code Status, Allergies, Isolation Precautions Code Status Info: DNR Allergies Info: Nitrofurantoin     Isolation Precautions Info: Emerging pathogen air and contact precautions        Discharge Medications: Please see discharge summary for a list of discharge medications. STOP taking these medications   cephALEXin 500 MG capsule Commonly known as: KEFLEX     TAKE these medications   ALPRAZolam 0.25 MG tablet Commonly known as: XANAX Take 1 tablet (0.25 mg total) by mouth 2 (two) times daily as needed for anxiety (Only give for panic attacks.  Do not give if patient is drowsy). What changed:   when to take this  reasons to take this   amLODipine 2.5 MG tablet Commonly known as: NORVASC Take 1 tablet (2.5 mg total) by mouth daily.   aspirin 325 MG tablet Take 325 mg by mouth every 6 (six) hours as needed for moderate pain.   benzocaine-resorcinol 5-2 % vaginal cream Commonly known as: VAGISIL Place 1 application vaginally daily as needed for itching.   dexamethasone 2 MG tablet Commonly known as: DECADRON Take 3 tablets (6 mg total) by mouth daily for 2 days, THEN 2 tablets (4 mg total) daily for 2 days, THEN 1 tablet (2 mg total) daily for 2 days, THEN 0.5 tablets (1 mg total) daily for 2 days. Start taking on: November 17, 2018   donepezil 10 MG tablet Commonly known  as: ARICEPT Take 1 tablet (10 mg total) by mouth at bedtime.   hydrocortisone cream 1 % Apply 1 application topically daily as needed for itching.   levothyroxine 50 MCG tablet Commonly known as: Synthroid Take 1 tablet (50 mcg total) by mouth daily before breakfast. Take 1 tablet (50 mcg total) by moth daily at 0600   QUEtiapine 50 MG tablet Commonly known as: SEROQUEL Take 1 tablet (50 mg total) by mouth at bedtime. What changed: how much to take   vitamin B-12 1000 MCG tablet Commonly known as:  CYANOCOBALAMIN Take 1,000 mcg by mouth daily.   1. New medication: Decadron taper over the next 8 days 2. Medication change: Patient previously was on Xanax 0.25 p.o. twice daily scheduled.  Her son clarified that she is supposed to be on Xanax twice a day only as needed.  This medicine is being changed back to only as needed.  Patient to be given this medicine when having panic attacks.  This medication is to be held when she is drowsy  Relevant Imaging Results:  Relevant Lab Results:   Additional Information SSN: [id number]  Alberteen Sam, LCSW

## 2018-11-17 NOTE — Evaluation (Addendum)
Physical Therapy Evaluation Patient Details Name: Whitney BiddingDixie Schmidt MRN: [id number] DOB: Nov 21, 1926 Today's Date: 11/17/2018   History of Present Illness  Whitney Schmidt is a 85 y.o. female with medical history significant of dementia, thyroid disease, hypertension, prior history of CVA who lives at a memory care unit [facility] recently diagnosed with COVID on 11/13/2018 , to ED x 3 and to CGV7/27, Positive for UTI.  Clinical Impression  The patient found with incontinence of B/B, assisted to be washed up, [facility] large BM. Patient able to stand with 1 max assist and 1 to assist with wash up and transfer. Patient's PLOF is unknown at this time. Patient currently requires 2 assist for transfers and hygiene care. Pt admitted with above diagnosis. Pt currently with functional limitations due to the deficits listed below (see PT Problem List).  Pt will benefit from skilled PT to increase their independence and safety with mobility to allow discharge to the venue listed below.       Follow Up Recommendations SNF    Equipment Recommendations  None recommended by PT    Recommendations for Other Services       Precautions / Restrictions Precautions Precautions: Fall Precaution Comments: incontinence      Mobility  Bed Mobility Overal bed mobility: Needs Assistance Bed Mobility: Rolling;Sidelying to Sit Rolling: Max assist;+2 for physical assistance;+2 for safety/equipment Sidelying to sit: Max assist;+2 for safety/equipment;+2 for physical assistance          Transfers Overall transfer level: Needs assistance Equipment used: 2 person hand held assist Transfers: Sit to/from [employer];Stand Pivot Transfers Sit to Stand: Max assist;+2 safety/equipment Stand pivot transfers: Max assist;+2 safety/equipment       General transfer comment: bear hug to stand [facility], Used rail and armhold to [facility] then pivot steps to  recliner.  Ambulation/Gait                Stairs            Wheelchair Mobility    Modified Rankin (Stroke Patients Only)       Balance Overall balance assessment: Needs assistance Sitting-balance support: Bilateral upper extremity supported;Feet supported                                         Pertinent Vitals/Pain Pain Assessment: Faces Faces Pain Scale: Hurts even more Pain Location: periarea with care after incontinent of B/B Pain Descriptors / Indicators: Discomfort;Grimacing Pain Intervention(s): Monitored during session    Home Living Family/patient expects to be discharged to:: Assisted living               Home Equipment: None Additional Comments: [facility]    Prior Function Level of Independence: Needs assistance               Hand Dominance        Extremity/Trunk Assessment        Lower Extremity Assessment Lower Extremity Assessment: (reports feet feel nunb)    Cervical / Trunk Assessment Cervical / Trunk Assessment: Kyphotic  Communication      Cognition Arousal/Alertness: Awake/alert Behavior During Therapy: Flat affect Overall Cognitive Status: No family/caregiver present to determine baseline cognitive functioning Area of Impairment: Orientation  General Comments: oriented to virus, and hospital      General Comments      Exercises     Assessment/Plan    PT Assessment Patient needs continued PT services  PT Problem List Decreased strength;Decreased mobility;Decreased safety awareness;Decreased knowledge of precautions;Decreased activity tolerance;Decreased cognition;Decreased balance;Decreased knowledge of use of DME       PT Treatment Interventions DME instruction;Therapeutic activities;Cognitive remediation;Gait training;Therapeutic exercise;Functional mobility training;Patient/family education    PT Goals (Current goals  can be found in the Care Plan section)       Frequency Min 2X/week   Barriers to discharge        Co-evaluation PT/OT/SLP Co-Evaluation/Treatment: Yes Reason for Co-Treatment: For patient/therapist safety PT goals addressed during session: Mobility/safety with mobility OT goals addressed during session: ADL's and self-care       AM-PAC PT "6 Clicks" Mobility  Outcome Measure Help needed turning from your back to your side while in a flat bed without using bedrails?: Total Help needed moving from lying on your back to sitting on the side of a flat bed without using bedrails?: Total Help needed moving to and from a bed to a chair (including a wheelchair)?: Total Help needed standing up from a chair using your arms (e.g., wheelchair or bedside chair)?: Total Help needed to walk in hospital room?: Total Help needed climbing 3-5 steps with a railing? : Total 6 Click Score: 6    End of Session   Activity Tolerance: Patient limited by pain;Patient limited by fatigue Patient left: with chair alarm set;with call bell/phone within reach Nurse Communication: Mobility status PT Visit Diagnosis: Difficulty in walking, not elsewhere classified (R26.2);Pain    Time: 4496-7591 PT Time Calculation (min) (ACUTE ONLY): 50 min   Charges:   PT Evaluation $PT Eval Moderate Complexity: 1 Mod PT Treatments $Therapeutic Activity: 8-22 mins        Tresa Endo PT Acute Rehabilitation Services  Office [phone]   Whitney Schmidt 11/17/2018, 1:50 PM

## 2018-11-17 NOTE — Progress Notes (Signed)
CSW lvm with Development worker, international aid at [employer] (via main # at [phone]).   CSW in communication with director Nathaniel Man regarding patient's need to discharge back to [facility] if possible as well as looking into circumstances that led to patient being admitted to [facility]. CSW will continue to work on dispo planning.   [place], [place]

## 2018-11-17 NOTE — Discharge Summary (Signed)
Discharge Summary  Whitney Schmidt ZOX:[id number] DOB: [birth year]  PCP: Whitney Schmidt  Admit date: 11/16/2018 Discharge date: 11/17/2018  Time spent: 40 minutes  Recommendations for Outpatient Follow-up:  1. New medication: Decadron taper over the next 8 days 2. Medication change: Patient previously was on Xanax 0.25 p.o. twice daily scheduled.  Her son clarified that she is supposed to be on Xanax twice a day only as needed.  This medicine is being changed back to only as needed.  Patient to be given this medicine when having panic attacks.  This medication is to be held when she is drowsy  Discharge Diagnoses:  Active Hospital Problems   Diagnosis Date Noted   COVID-19 virus infection 11/16/2018   Dehydration 11/16/2018   E. coli UTI 11/16/2018   Weakness    Dementia (HCC) 03/06/2018   Hypertension 03/06/2018   Hypothyroidism 03/06/2018   History of cardioembolic cerebrovascular accident (CVA) 03/06/2018    Resolved Hospital Problems  No resolved problems to display.    Discharge Condition: Improved, going back to skilled nursing  Diet recommendation: Low-sodium  Vitals:   11/17/18 0800 11/17/18 0858  BP: (!) 144/65 (!) 126/59  Pulse: (!) 59 (!) 56  Resp: 15 15  Temp:  (!) 97.4 F (36.3 C)  SpO2: (!) 89% 96%    History of present illness:  85 year old female with past medical history of dementia, hypothyroidism, hypertension and previous history of CVA who lives at [facility] with a memory care unit previously diagnosed with COVID on 7/24 was brought into the emergency room on 7/24 for weakness.  At that time, reportedly patient had fever for several days as well as decreased appetite.  Work-up at that time the emergency room was unrevealing.  No UTI, chest x-ray was clear.  Patient not hypoxic.  Was positive for COVID and sent back for quarantine.  She is return back on 7/25 for recheck for concerns of dehydration.  Oxygen saturation still  normal.  Repeat chest x-ray negative for pneumonia.  Patient sent back for quarantine.  Brought back on 7/27 with concerns for weakness.  Labs unrevealing.  Vital stable.  Repeat urinalysis unremarkable.  CRP was noted to be elevated at 6.  Given repeat ER visits, patient watched overnight, [facility] given positive cover test.  Hospital Course:  Active Problems:   Dementia (HCC): Stable, continued on Aricept.    Hypertension: Stable, continue home medications.    Hypothyroidism: Stable, continue on Synthroid   History of cardioembolic cerebrovascular accident (CVA): Stable continued on aspirin    COVID-19 virus infection: Asymptomatic from this.  Given elevated CRP, she was started on steroids.  Not hypoxic, no need for Remdisivir or Actemra.  Recommend discharge continue quarantine.  Patient will be discharged on an 8-day Decadron taper.    Dehydration: Normal BUN/creatinine on admission.  Suspect she may have been a little bit weaker because of COVID versus decreased p.o.  See below      Weakness: Suspect her weakness was actually more related to her medication.  Patient takes Xanax twice daily as needed for anxiety.  Patient's son clarified that patient only takes his medication as needed.  According to her medication list from the facility, patient has been on scheduled Xanax.  This medication was held this morning given that she was slightly drowsy.  Symptoms medication was stopped, little while later, patient woke right up.  She was appropriate, eating breakfast, pleasant.  Have advised that her  Xanax should be changed back to as needed and not to give 1 she is drowsy.   Procedures:  None  Consultations:  None  Discharge Exam: BP (!) 126/59    Pulse (!) 56    Temp (!) 97.4 F (36.3 C)    Resp 15    SpO2 96%   General: Alert and oriented x1, no acute distress Cardiovascular: Regular rate and rhythm, S1-S2 Respiratory: Clear to auscultation  bilaterally  Discharge Instructions You were cared for by a hospitalist during your hospital stay. If you have any questions about your discharge medications or the care you received while you were in the hospital after you are discharged, you can call the unit and asked to speak with the hospitalist on call if the hospitalist that took care of you is not available. Once you are discharged, your primary care physician will handle any further medical issues. Please note that NO REFILLS for any discharge medications will be authorized once you are discharged, as it is imperative that you return to your primary care physician (or establish a relationship with a primary care physician if you do not have one) for your aftercare needs so that they can reassess your need for medications and monitor your lab values.  Discharge Instructions    Diet - low sodium heart healthy   Complete by: As directed    Increase activity slowly   Complete by: As directed      Allergies as of 11/17/2018      Reactions   Nitrofurantoin       Medication List    STOP taking these medications   cephALEXin 500 MG capsule Commonly known as: KEFLEX     TAKE these medications   ALPRAZolam 0.25 MG tablet Commonly known as: XANAX Take 1 tablet (0.25 mg total) by mouth 2 (two) times daily as needed for anxiety (Only give for panic attacks.  Do not give if patient is drowsy). What changed:   when to take this  reasons to take this   amLODipine 2.5 MG tablet Commonly known as: NORVASC Take 1 tablet (2.5 mg total) by mouth daily.   aspirin 325 MG tablet Take 325 mg by mouth every 6 (six) hours as needed for moderate pain.   benzocaine-resorcinol 5-2 % vaginal cream Commonly known as: VAGISIL Place 1 application vaginally daily as needed for itching.   dexamethasone 2 MG tablet Commonly known as: DECADRON Take 3 tablets (6 mg total) by mouth daily for 2 days, THEN 2 tablets (4 mg total) daily for 2 days, THEN 1  tablet (2 mg total) daily for 2 days, THEN 0.5 tablets (1 mg total) daily for 2 days. Start taking on: November 17, 2018   donepezil 10 MG tablet Commonly known as: ARICEPT Take 1 tablet (10 mg total) by mouth at bedtime.   hydrocortisone cream 1 % Apply 1 application topically daily as needed for itching.   levothyroxine 50 MCG tablet Commonly known as: Synthroid Take 1 tablet (50 mcg total) by mouth daily before breakfast. Take 1 tablet (50 mcg total) by moth daily at 0600   QUEtiapine 50 MG tablet Commonly known as: SEROQUEL Take 1 tablet (50 mg total) by mouth at bedtime. What changed: how much to take   vitamin B-12 1000 MCG tablet Commonly known as: CYANOCOBALAMIN Take 1,000 mcg by mouth daily.      Allergies  Allergen Reactions   Nitrofurantoin       The results of significant diagnostics from  this hospitalization (including imaging, microbiology, ancillary and laboratory) are listed below for reference.    Significant Diagnostic Studies: Dg Chest Port 1 View  Result Date: 11/16/2018 CLINICAL DATA:  Increasing weakness.  COVID-19 infection. EXAM: PORTABLE CHEST 1 VIEW COMPARISON:  Radiographs 11/13/2018 and 03/06/2018. FINDINGS: 1519 hours. Two views obtained. The heart size and mediastinal contours are stable. There is mild aortic atherosclerosis. Patchy left-greater-than-right basilar pulmonary opacities are similar to the prior studies, likely due to scarring or chronic atelectasis. There is no new airspace disease, edema, significant pleural effusion or pneumothorax. The bones appear unchanged. Telemetry leads overlie the chest. IMPRESSION: Stable chronic bibasilar atelectasis or scarring. No acute findings identified. Electronically Signed   By: Carey BullocksWilliam  Veazey M.D.   On: 11/16/2018 16:22   Dg Chest Portable 1 View  Result Date: 11/13/2018 CLINICAL DATA:  85 year old female with shortness of breath. EXAM: PORTABLE CHEST 1 VIEW COMPARISON:  None. FINDINGS: Minimal  left lung base atelectasis. No focal consolidation, pleural effusion, or pneumothorax. Mild cardiomegaly. Osteopenia. No acute osseous pathology. IMPRESSION: No active disease. Electronically Signed   By: Elgie CollardArash  Radparvar M.D.   On: 11/13/2018 03:15    Microbiology: Recent Results (from the past 240 hour(s))  Blood Culture (routine x 2)     Status: None (Preliminary result)   Collection Time: 11/13/18  3:05 AM   Specimen: BLOOD  Result Value Ref Range Status   Specimen Description BLOOD LEFT ANTECUBITAL  Final   Special Requests   Final    BOTTLES DRAWN AEROBIC AND ANAEROBIC Blood Culture results may not be optimal due to an inadequate volume of blood received in culture bottles   Culture   Final    NO GROWTH 4 DAYS Performed at [facility], [address] [place], [place] [address]    Report Status PENDING  Incomplete  Urine culture     Status: Abnormal   Collection Time: 11/13/18  3:20 AM   Specimen: Urine, Random  Result Value Ref Range Status   Specimen Description URINE, RANDOM  Final   Special Requests   Final    NONE Performed at [facility], [address] [place], [place] [address]    Culture >=100,000 COLONIES/mL ESCHERICHIA COLI (A)  Final   Report Status 11/14/2018 FINAL  Final   Organism ID, Bacteria ESCHERICHIA COLI (A)  Final      Susceptibility   Escherichia coli - MIC*    AMPICILLIN >=32 RESISTANT Resistant     CEFAZOLIN <=4 SENSITIVE Sensitive     CEFTRIAXONE <=1 SENSITIVE Sensitive     CIPROFLOXACIN >=4 RESISTANT Resistant     GENTAMICIN >=16 RESISTANT Resistant     IMIPENEM <=0.25 SENSITIVE Sensitive     NITROFURANTOIN 128 RESISTANT Resistant     TRIMETH/SULFA <=20 SENSITIVE Sensitive     AMPICILLIN/SULBACTAM >=32 RESISTANT Resistant     PIP/TAZO 8 SENSITIVE Sensitive     Extended ESBL NEGATIVE Sensitive     * >=100,000 COLONIES/mL ESCHERICHIA COLI  SARS Coronavirus 2 (CEPHEID- Performed in [facility] hospital lab), Hosp Order      Status: Abnormal   Collection Time: 11/13/18  3:21 AM   Specimen: Nasopharyngeal Swab  Result Value Ref Range Status   SARS Coronavirus 2 POSITIVE (A) NEGATIVE Final    Comment: CRITICAL RESULT CALLED TO, READ BACK BY AND VERIFIED WITH: BEN BAILIFF RN @ 0427 ON 11/13/18 BY ROBINSON Z.  (NOTE) If result is NEGATIVE SARS-CoV-2 target nucleic acids are NOT DETECTED. The SARS-CoV-2  RNA is generally detectable in upper and lower  respiratory specimens during the acute phase of infection. The lowest  concentration of SARS-CoV-2 viral copies this assay can detect is 250  copies / mL. A negative result does not preclude SARS-CoV-2 infection  and should not be used as the sole basis for treatment or other  patient management decisions.  A negative result may occur with  improper specimen collection / handling, submission of specimen other  than nasopharyngeal swab, presence of viral mutation(s) within the  areas targeted by this assay, and inadequate number of viral copies  (<250 copies / mL). A negative result must be combined with clinical  observations, patient history, and epidemiological information. If result is POSITIVE SARS-CoV-2 target nucleic  acids are DETECTED. The SARS-CoV-2 RNA is generally detectable in upper and lower  respiratory specimens during the acute phase of infection.  Positive  results are indicative of active infection with SARS-CoV-2.  Clinical  correlation with patient history and other diagnostic information is  necessary to determine patient infection status.  Positive results do  not rule out bacterial infection or co-infection with other viruses. If result is PRESUMPTIVE POSTIVE SARS-CoV-2 nucleic acids MAY BE PRESENT.   A presumptive positive result was obtained on the submitted specimen  and confirmed on repeat testing.  While 2019 novel coronavirus  (SARS-CoV-2) nucleic acids may be present in the submitted sample  additional confirmatory testing may be  necessary for epidemiological  and / or clinical management purposes  to differentiate between  SARS-CoV-2 and other Sarbecovirus currently known to infect humans.  If clinically indicated additional testing with an alternate test  me thodology [phone]) is advised. The SARS-CoV-2 RNA is generally  detectable in upper and lower respiratory specimens during the acute  phase of infection. The expected result is Negative. Fact Sheet for Patients:  BoilerBrush.com.cy Fact Sheet for Healthcare Providers: https://pope.com/ This test is not yet approved or cleared by the [place] FDA and has been authorized for detection and/or diagnosis of SARS-CoV-2 by FDA under an Emergency Use Authorization (EUA).  This EUA will remain in effect (meaning this test can be used) for the duration of the COVID-19 declaration under Section 564(b)(1) of the Act, 21 U.S.C. section 360bbb-3(b)(1), unless the authorization is terminated or revoked sooner. Performed at [facility], [address], [place], [place] [address]   Blood Culture (routine x 2)     Status: None (Preliminary result)   Collection Time: 11/16/18  3:17 PM   Specimen: BLOOD  Result Value Ref Range Status   Specimen Description   Final    BLOOD LEFT ANTECUBITAL Performed at [facility], [address] [place], [place] [address]    Special Requests   Final    BOTTLES DRAWN AEROBIC AND ANAEROBIC Blood Culture adequate volume Performed at [facility], [address], [place], [place] [address]    Culture   Final    NO GROWTH < 12 HOURS Performed at [facility], [address] [place], [place] [address]    Report Status PENDING  Incomplete  Blood Culture (routine x 2)     Status: None (Preliminary result)   Collection Time: 11/16/18  3:22 PM   Specimen: BLOOD  Result Value Ref Range Status   Specimen Description   Final    BLOOD RIGHT  ANTECUBITAL Performed at [facility], [address], [place], [place] [address]    Special Requests  Final    BOTTLES DRAWN AEROBIC AND ANAEROBIC Blood Culture results may not be optimal due to an excessive volume of blood received in culture bottles Performed at [place] [address], [place], [place] [address]    Culture   Final    NO GROWTH < 12 HOURS Performed at [place] [address] [place], [place] [address]    Report Status PENDING  Incomplete     Labs: Basic Metabolic Panel: Recent Labs  Lab 11/13/18 0341 11/16/18 1517  NA 136 136  K 3.8 3.5  CL 101 99  CO2 24 24  GLUCOSE 108* 103*  BUN 23 31*  CREATININE 0.92 0.93  CALCIUM 8.7* 8.5*   Liver Function Tests: Recent Labs  Lab 11/13/18 0341 11/16/18 1517  AST 41 62*  ALT 17 23  ALKPHOS 45 42  BILITOT 0.5 0.4  PROT 6.9 7.3  ALBUMIN 3.7 3.7   No results for input(s): LIPASE, AMYLASE in the last 168 hours. No results for input(s): AMMONIA in the last 168 hours. CBC: Recent Labs  Lab 11/13/18 0341 11/16/18 1517  WBC 3.6* 4.5  NEUTROABS 1.4* 1.8  HGB 11.6* 12.8  HCT 37.0 40.7  MCV 96.1 96.4  PLT 140* 152   Cardiac Enzymes: No results for input(s): CKTOTAL, CKMB, CKMBINDEX, TROPONINI in the last 168 hours. BNP: BNP (last 3 results) No results for input(s): BNP in the last 8760 hours.  ProBNP (last 3 results) No results for input(s): PROBNP in the last 8760 hours.  CBG: No results for input(s): GLUCAP in the last 168 hours.     Signed:  Annita Brod, [facility] 11/17/2018, 1:38 PM

## 2018-11-17 NOTE — Evaluation (Signed)
Occupational Therapy Evaluation Patient Details Name: Whitney Schmidt MRN: [id number] DOB: [birth year] Today's Date: 11/17/2018    History of Present Illness Jasnoor Wiechman is a 85 y.o. female with medical history significant of dementia, thyroid disease, hypertension, prior history of CVA who lives at a memory care unit at [facility] recently diagnosed with COVID on 11/13/2018 , to ED x 3 and to CGV7/27, Positive for UTI.   Clinical Impression   PTA, pt was living at [facility]; unsure of required assistance PTA. Pt currently requiring Max A for LB ADLs, toileting, and functional transfers. Pt oriented to self and that she "has the virus". Pt presenting with fatigue, poor balance, and decreased activity tolerance. Pt with large bowel incontinence in bed and required assistance to [facility] and to clean with Max A +2. Pt would benefit from further acute OT to facilitate safe dc. Recommend dc to SNF for further OT to optimize safety, independence with ADLs, and return to PLOF.      Follow Up Recommendations  SNF;Supervision/Assistance - 24 hour    Equipment Recommendations  Other (comment)(Defer to next venue)    Recommendations for Other Services PT consult     Precautions / Restrictions Precautions Precautions: Fall Precaution Comments: incontinence Restrictions Weight Bearing Restrictions: No      Mobility Bed Mobility Overal bed mobility: Needs Assistance Bed Mobility: Rolling;Sidelying to Sit Rolling: +2 for safety/equipment;Min assist;+2 for physical assistance Sidelying to sit: Max assist;+2 for safety/equipment;+2 for physical assistance       General bed mobility comments: Min A for rolling to right during peri care and cleaning with BM in bed. Pt requiring Max A to power up into sitting from sidelying.   Transfers Overall transfer level: Needs assistance Equipment used: 2 person hand held assist Transfers: Sit to/from [employer] Sit to Stand: Max assist;+2 safety/equipment Stand pivot transfers: Max assist;+2 safety/equipment       General transfer comment: bear hug to stand from bed and step to [facility], Used rail and armhold to stand from [facility] then pivot steps to recliner.    Balance Overall balance assessment: Needs assistance Sitting-balance support: Bilateral upper extremity supported;Feet supported Sitting balance-Leahy Scale: Fair     Standing balance support: Bilateral upper extremity supported;During functional activity Standing balance-Leahy Scale: Poor Standing balance comment: Reliant on physical A                           ADL either performed or assessed with clinical judgement   ADL Overall ADL's : Needs assistance/impaired Eating/Feeding: Set up;Supervision/ safety;Sitting   Grooming: Wash/dry hands;Set up;Supervision/safety;Sitting   Upper Body Bathing: Minimal assistance;Sitting   Lower Body Bathing: Moderate assistance;Sit to/from stand;Maximal assistance   Upper Body Dressing : Minimal assistance;Sitting   Lower Body Dressing: Maximal assistance;Sit to/from stand   Toilet Transfer: Maximal assistance;+2 for safety/equipment;+2 for physical assistance;Stand-pivot;BSC Toilet Transfer Details (indicate cue type and reason): Max A +2 for balance and sequencing of pivot towards BSC.  Toileting- Clothing Manipulation and Hygiene: Maximal assistance;+2 for physical assistance;Sit to/from stand Toileting - Clothing Manipulation Details (indicate cue type and reason): One person for Max A to maintain standing. Second person for peri care     Functional mobility during ADLs: Maximal assistance;+2 for physical assistance(Stand pivot only) General ADL Comments: Pt with bowel incontience in bed. Agreeable to [facility] and then to recliner.      Vision         Perception  Praxis      Pertinent Vitals/Pain Pain Assessment: Faces Faces Pain Scale: Hurts even more Pain  Location: periarea with care after incontinent of B/B Pain Descriptors / Indicators: Discomfort;Grimacing Pain Intervention(s): Monitored during session;Limited activity within patient's tolerance;Repositioned     Hand Dominance     Extremity/Trunk Assessment Upper Extremity Assessment Upper Extremity Assessment: Generalized weakness   Lower Extremity Assessment Lower Extremity Assessment: Defer to PT evaluation   Cervical / Trunk Assessment Cervical / Trunk Assessment: Kyphotic(Scoliosis?)   Communication Communication Communication: HOH   Cognition Arousal/Alertness: Awake/alert Behavior During Therapy: Flat affect Overall Cognitive Status: No family/caregiver present to determine baseline cognitive functioning Area of Impairment: Orientation                               General Comments: oriented to virus, and hospital   General Comments  VSS throughout on RA    Exercises     Shoulder Instructions      Home Living Family/patient expects to be discharged to:: Assisted living                             Home Equipment: None   Additional Comments: [facility]      Prior Functioning/Environment Level of Independence: Needs assistance  Gait / Transfers Assistance Needed: Pt reports she was walking PTA ADL's / Homemaking Assistance Needed: Assume required supervision for ADLs for safety; possible need for increased assistance   Comments: Pt poor historian and unable to provide information        OT Problem List: Decreased strength;Decreased range of motion;Impaired balance (sitting and/or standing);Decreased activity tolerance;Decreased safety awareness;Decreased knowledge of use of DME or AE;Decreased knowledge of precautions      OT Treatment/Interventions: Self-care/ADL training;Therapeutic exercise;Energy conservation;DME and/or AE instruction;Therapeutic activities;Patient/family education    OT Goals(Current  goals can be found in the care plan section) Acute Rehab OT Goals Patient Stated Goal: "Feel better" OT Goal Formulation: With patient Time For Goal Achievement: 12/01/18 Potential to Achieve Goals: Good  OT Frequency: Min 2X/week   Barriers to D/C:            Co-evaluation PT/OT/SLP Co-Evaluation/Treatment: Yes Reason for Co-Treatment: For patient/therapist safety;To address functional/ADL transfers PT goals addressed during session: Mobility/safety with mobility OT goals addressed during session: ADL's and self-care      AM-PAC OT "6 Clicks" Daily Activity     Outcome Measure Help from another person eating meals?: A Little Help from another person taking care of personal grooming?: A Little Help from another person toileting, which includes using toliet, bedpan, or urinal?: A Lot Help from another person bathing (including washing, rinsing, drying)?: A Lot Help from another person to put on and taking off regular upper body clothing?: A Little Help from another person to put on and taking off regular lower body clothing?: A Lot 6 Click Score: 15   End of Session Equipment Utilized During Treatment: Gait belt Nurse Communication: Mobility status;Other (comment)(Large BM; redness in peri area would not use purewick)  Activity Tolerance: Patient tolerated treatment well Patient left: in chair;with call bell/phone within reach;with chair alarm set  OT Visit Diagnosis: Unsteadiness on feet (R26.81);Other abnormalities of gait and mobility (R26.89);Muscle weakness (generalized) (M62.81)                Time: [id number] OT Time Calculation (min): 50 min Charges:  OT General Charges $  OT Visit: 1 Visit OT Evaluation $OT Eval Moderate Complexity: 1 Mod  Brit Wernette MSOT, OTR/L Acute Rehab Pager: [phone] Office: [phone]  Theodoro GristCharis M Jizelle Conkey 11/17/2018, 3:25 PM

## 2018-11-17 NOTE — Progress Notes (Signed)
Pt being DCed back to [facility] care at [facility] by [employer]. Son Shanon Brow was informed

## 2018-11-17 NOTE — TOC Transition Note (Signed)
Transition of Care [facility]) - CM/SW Discharge Note   Patient Details  Name: Whitney Schmidt MRN: [id number] Date of Birth: [birth year]  Transition of Care [facility]) CM/SW Contact:  Alberteen Sam, LCSW Phone Number: 11/17/2018, 4:14 PM   Clinical Narrative:     Patient to be transferred to [facility] on this day, [facility] has been called for transport and patient's son Whitney Schmidt has been informed. [facility] has been faxed FL2 and Discharge summary. No further needs.   Final next level of care: Assisted Living Barriers to Discharge: No Barriers Identified   Patient Goals and CMS Choice Patient states their goals for this hospitalization and ongoing recovery are:: to go back to [facility] CMS Medicare.gov Compare Post Acute Care list provided to:: Patient Represenative (must comment)(son Whitney Schmidt) Choice offered to / list presented to : Adult Children([name])  Discharge Placement PASRR number recieved: 11/17/18            Patient chooses bed at: [facility] Patient to be transferred to facility by: [facility] Name of family member notified: Whitney Schmidt Patient and family notified of of transfer: 11/17/18  Discharge Plan and Services                                     Social Determinants of Health (SDOH) Interventions     Readmission Risk Interventions No flowsheet data found.

## 2018-11-17 NOTE — Plan of Care (Signed)
Patient has had a remarkable shift. She ate breakfast and lunch after being assisted with setup. MD at bedside during breakfast. Her mental status has improved and she is able to tell me her name and where she lives. She can follow commands. She is unaware of the date and time, situation, and place. She had an incontinent episode with PT today and was cleaned up. She has been requesting things and is currently talking to her son on the phone. Spoke with son, patient concerned and in pursuit of her glasses. Son has questions about patient being discharged today as to where she will go and how long it will take. Told son we will keep him updated.

## 2018-11-17 NOTE — Progress Notes (Addendum)
0530 Report called to [place] at [facility] place, per [place] pt has a bed available in the [place] area not the SNF. I tried to call and [employer] after hours to question about the situation. MD made aware. Per MD [place] Pink, pt can go back to "Whichever bed she came from before", charge nurse Claiborne Billings made aware of situation.   1909 Attempted to call pt's son Shanon Brow to make him aware that we could not find pt's glasses in her room, he did not answer. I will make charge nurse aware.

## 2018-11-18 LAB — URINE CULTURE: Culture: NO GROWTH

## 2018-11-18 LAB — CULTURE, BLOOD (ROUTINE X 2): Culture: NO GROWTH

## 2018-11-21 LAB — CULTURE, BLOOD (ROUTINE X 2)
Culture: NO GROWTH
Culture: NO GROWTH
Special Requests: ADEQUATE

## 2020-03-23 ENCOUNTER — Encounter (HOSPITAL_COMMUNITY): Payer: Self-pay

## 2020-03-23 ENCOUNTER — Emergency Department (HOSPITAL_COMMUNITY)
Admission: EM | Admit: 2020-03-23 | Discharge: 2020-03-24 | Disposition: A | Discharge status: Home / Self Care | Payer: Medicare PPO | Attending: Emergency Medicine | Admitting: Emergency Medicine

## 2020-03-23 ENCOUNTER — Other Ambulatory Visit: Payer: Self-pay

## 2020-03-23 DIAGNOSIS — R600 Localized edema: Secondary | ICD-10-CM

## 2020-03-23 DIAGNOSIS — R2240 Localized swelling, mass and lump, unspecified lower limb: Secondary | ICD-10-CM | POA: Diagnosis present

## 2020-03-23 DIAGNOSIS — H04121 Dry eye syndrome of right lacrimal gland: Secondary | ICD-10-CM | POA: Diagnosis not present

## 2020-03-23 DIAGNOSIS — F0390 Unspecified dementia without behavioral disturbance: Secondary | ICD-10-CM | POA: Insufficient documentation

## 2020-03-23 LAB — COMPREHENSIVE METABOLIC PANEL
ALT: 22 U/L (ref 0–44)
AST: 33 U/L (ref 15–41)
Albumin: 3.6 g/dL (ref 3.5–5.0)
Alkaline Phosphatase: 60 U/L (ref 38–126)
Anion gap: 12 (ref 5–15)
BUN: 40 mg/dL — ABNORMAL HIGH (ref 8–23)
CO2: 24 mmol/L (ref 22–32)
Calcium: 8.8 mg/dL — ABNORMAL LOW (ref 8.9–10.3)
Chloride: 98 mmol/L (ref 98–111)
Creatinine, Ser: 1.13 mg/dL — ABNORMAL HIGH (ref 0.44–1.00)
GFR, Estimated: 46 mL/min — ABNORMAL LOW (ref >60)
Glucose, Bld: 134 mg/dL — ABNORMAL HIGH (ref 70–99)
Potassium: 3.6 mmol/L (ref 3.5–5.1)
Sodium: 134 mmol/L — ABNORMAL LOW (ref 135–145)
Total Bilirubin: 1.1 mg/dL (ref 0.3–1.2)
Total Protein: 7.5 g/dL (ref 6.5–8.1)

## 2020-03-23 LAB — CBC
HCT: 33.9 % — ABNORMAL LOW (ref 36.0–46.0)
Hemoglobin: 10.6 g/dL — ABNORMAL LOW (ref 12.0–15.0)
MCH: 30.3 pg (ref 26.0–34.0)
MCHC: 31.3 g/dL (ref 30.0–36.0)
MCV: 96.9 fL (ref 80.0–100.0)
Platelets: 219 10*3/uL (ref 150–400)
RBC: 3.5 MIL/uL — ABNORMAL LOW (ref 3.87–5.11)
RDW: 14.7 % (ref 11.5–15.5)
WBC: 10.4 10*3/uL (ref 4.0–10.5)
nRBC: 0 % (ref 0.0–0.2)

## 2020-03-23 LAB — BRAIN NATRIURETIC PEPTIDE: B Natriuretic Peptide: 125.8 pg/mL — ABNORMAL HIGH (ref 0.0–100.0)

## 2020-03-23 MED ORDER — DOXYCYCLINE HYCLATE 100 MG PO CAPS: 100.0000 mg | ORAL_CAPSULE | Freq: Two times a day (BID) | ORAL | 0 refills | Status: AC

## 2020-03-23 NOTE — ED Notes (Signed)
[facility] contacted to transport. 

## 2020-03-23 NOTE — ED Triage Notes (Signed)
Pt BIB EMS from [facility]. Facility called EMS due to bilateral leg swelling. Pt A&O to baseline. Hx of arthritis in knees and dementia. No hx of CHF.

## 2020-03-23 NOTE — ED Provider Notes (Signed)
[facility]-EMERGENCY DEPT [facility] Emergency Department Provider Note MRN:  [id number]  Arrival date & time: 03/23/20     Chief Complaint   Leg Swelling   History of Present Illness   Whitney Schmidt is a 86 y.o. year-old female with a history of dementia presenting to the ED with chief complaint of legs with.  Facility noted leg swelling today, sent here for evaluation.  Patient complaining mostly of right eye dryness, denies chest pain or shortness of breath, no abdominal pain, she is unsure when the legs began to swell.  I was unable to obtain an accurate HPI, PMH, or ROS due to the patient's dementia.  Level 5 caveat.  Review of Systems  Positive for leg swelling, eye irritation.  Patient's Health History    Past Medical History:  Diagnosis Date  . Anxiety   . Dementia (HCC)   . Memory loss   . Thyroid disease     Past Surgical History:  Procedure Laterality Date  . ABDOMINAL HYSTERECTOMY    . ABDOMINAL HYSTERECTOMY    . ANTERIOR AND POSTERIOR VAGINAL REPAIR    . APPENDECTOMY    . FOOT SURGERY    . KNEE SURGERY    . TONSILLECTOMY    . URETHRAL SLING      Family History  Problem Relation Age of Onset  . Hypertension Mother   . Cancer Mother   . Stroke Father   . Hypertension Father     Social History   Socioeconomic History  . Marital status: Widowed    Spouse name: Not on file  . Number of children: Not on file  . Years of education: Not on file  . Highest education level: Not on file  Occupational History  . Not on file  Tobacco Use  . Smoking status: Never Smoker  . Smokeless tobacco: Never Used  Substance and Sexual Activity  . Alcohol use: No  . Drug use: No  . Sexual activity: Not on file  Other Topics Concern  . Not on file  Social History Narrative  . Not on file   Social Determinants of Health   Financial Resource Strain:   . Difficulty of Paying Living Expenses: Not on file  Food Insecurity:   . Worried About Programme researcher, broadcasting/film/video in  the Last Year: Not on file  . Ran Out of Food in the Last Year: Not on file  Transportation Needs:   . Lack of Transportation (Medical): Not on file  . Lack of Transportation (Non-Medical): Not on file  Physical Activity:   . Days of Exercise per Week: Not on file  . Minutes of Exercise per Session: Not on file  Stress:   . Feeling of Stress : Not on file  Social Connections:   . Frequency of Communication with Friends and Family: Not on file  . Frequency of Social Gatherings with Friends and Family: Not on file  . Attends Religious Services: Not on file  . Active Member of Clubs or Organizations: Not on file  . Attends Banker Meetings: Not on file  . Marital Status: Not on file  Intimate Partner Violence:   . Fear of Current or Ex-Partner: Not on file  . Emotionally Abused: Not on file  . Physically Abused: Not on file  . Sexually Abused: Not on file     Physical Exam   Vitals:   03/23/20 1745 03/23/20 1854  BP: (!) 122/51 123/86  Pulse: 77 85  Resp: 18 20  Temp: 99 F (37.2 C)   SpO2: 97% 97%    CONSTITUTIONAL: Chronically ill-appearing, NAD NEURO:  Alert and oriented x 3, no focal deficits EYES:  eyes equal and reactive ENT/NECK:  no LAD, no JVD CARDIO: Regular rate, well-perfused, normal S1 and S2 PULM:  CTAB no wheezing or rhonchi GI/GU:  normal bowel sounds, non-distended, non-tender MSK/SPINE:  No gross deformities, 2+ pitting edema to bilateral lower extremities with bilateral erythematous skin changes SKIN:  no rash, atraumatic PSYCH:  Appropriate speech and behavior  *Additional and/or pertinent findings included in MDM below  Diagnostic and Interventional Summary    EKG Interpretation  Date/Time:    Ventricular Rate:    PR Interval:    QRS Duration:   QT Interval:    QTC Calculation:   R Axis:     Text Interpretation:        Labs Reviewed  CBC - Abnormal; Notable for the following components:      Result Value   RBC 3.50 (*)      Hemoglobin 10.6 (*)    HCT 33.9 (*)    All other components within normal limits  COMPREHENSIVE METABOLIC PANEL - Abnormal; Notable for the following components:   Sodium 134 (*)    Glucose, Bld 134 (*)    BUN 40 (*)    Creatinine, Ser 1.13 (*)    Calcium 8.8 (*)    GFR, Estimated 46 (*)    All other components within normal limits  BRAIN NATRIURETIC PEPTIDE - Abnormal; Notable for the following components:   B Natriuretic Peptide 125.8 (*)    All other components within normal limits    LE VENOUS    (Results Pending)    Medications - No data to display   Procedures  /  Critical Care Procedures  ED Course and Medical Decision Making  I have reviewed the triage vital signs, the nursing notes, and pertinent available records from the EMR.  Listed above are laboratory and imaging tests that I personally ordered, reviewed, and interpreted and then considered in my medical decision making (see below for details).  Considering venous insufficiency, CHF, renal failure, liver failure, cellulitis, less likely DVT.  Work-up pending.     Work-up is reassuring, will cover for cellulitis with doxycycline and discharged home.  Ultrasound for tomorrow morning ordered to exclude DVT.  Appropriate for discharge.  Elmer Sow. Pilar Plate, MD [facility] Emergency Medicine [facility] [email or web address]  Final Clinical Impressions(s) / ED Diagnoses     ICD-10-CM   1. Leg edema  R60.0     ED Discharge Orders         Ordered    doxycycline (VIBRAMYCIN) 100 MG capsule  2 times daily        03/23/20 2208    LE VENOUS        03/23/20 2210           Discharge Instructions Discussed with and Provided to Patient:     Discharge Instructions     You were evaluated in the Emergency Department and after careful evaluation, we did not find any emergent condition requiring admission or further testing in the hospital.  Your exam/testing today was overall reassuring.  Your labs  are reassuring.  We are prescribing antibiotics to cover for a skin infection.  We asked that she return to the emergency department tomorrow for ultrasound of the legs to exclude the possibility of blood clots.  Please return to the Emergency Department if  you experience any worsening of your condition.  Thank you for allowing [place] to be a part of your care.        Sabas Sous, MD 03/23/20 2212

## 2020-03-23 NOTE — Discharge Instructions (Addendum)
You were evaluated in the Emergency Department and after careful evaluation, we did not find any emergent condition requiring admission or further testing in the hospital.  Your exam/testing today was overall reassuring.  Your labs are reassuring.  We are prescribing antibiotics to cover for a skin infection.  We asked that she return to the emergency department tomorrow for ultrasound of the legs to exclude the possibility of blood clots.  Please return to the Emergency Department if you experience any worsening of your condition.  Thank you for allowing [place] to be a part of your care.

## 2020-03-23 NOTE — ED Notes (Signed)
Report called to [facility] at [employer]. Reiterated the importance of patient going to [place] in the morning for doppler studies.

## 2020-03-24 ENCOUNTER — Emergency Department (HOSPITAL_COMMUNITY): Admission: RE | Admit: 2020-03-24 | Payer: Medicare PPO | Source: Ambulatory Visit

## 2020-11-24 ENCOUNTER — Other Ambulatory Visit: Payer: Self-pay

## 2020-11-24 ENCOUNTER — Non-Acute Institutional Stay: Payer: Medicare PPO | Admitting: Hospice

## 2020-11-24 DIAGNOSIS — E039 Hypothyroidism, unspecified: Secondary | ICD-10-CM

## 2020-11-24 DIAGNOSIS — Z515 Encounter for palliative care: Secondary | ICD-10-CM

## 2020-11-24 DIAGNOSIS — F0390 Unspecified dementia without behavioral disturbance: Secondary | ICD-10-CM

## 2020-11-24 DIAGNOSIS — I10 Essential (primary) hypertension: Secondary | ICD-10-CM

## 2020-11-24 NOTE — Progress Notes (Signed)
[facility] Consult Note Telephone: [phone]  Fax: [phone]  PATIENT NAME: Whitney Schmidt [id number] [phone] (home)  DOB: July 15, 1926 MRN: [id number]  PRIMARY CARE PROVIDER:    Marvis Moeller NP  REFERRING PROVIDER:   Marvis Moeller NP  RESPONSIBLE PARTY:   Whitney Schmidt Information     Name Relation Home Work Mobile   Whitney Schmidt [phone]  [phone]   Whitney Schmidt Daughter [phone]  [phone]        I met face to face with patient  at facility. Palliative Care was asked to follow this patient by consultation request of Whitney Moeller NP to address advance care planning, complex medical decision making and goals of care clarification.  NP called Whitney Schmidt, could not leave a voicemail because mailbox was full.  This is the initial visit.     ASSESSMENT AND / RECOMMENDATIONS:   Advance Care Planning: Our advance care planning conversation included a discussion about:    The value and importance of advance care planning  Difference between Hospice and Palliative care Exploration of goals of care in the event of a sudden injury or illness  Identification and preparation of a healthcare agent  Review and updating or creation of an  advance directive document .   CODE STATUS: Patient is a DO NOT RESUSCITATE.  DNR form already in epic.  Goals of Care: Goals include to maximize quality of life and symptom management  I spent  25 minutes providing this initial consultation. More than 50% of the time in this consultation was spent on counseling patient and coordinating communication. --------------------------------------------------------------------------------------------------------------------------------------  Symptom Management/Plan: Dementia: Memory loss/confusion in line with dementia disease trajectory.  Continue Aricept as ordered.  Encourage  participation in activities, word search/puzzles/colorings.  Safety precautions. HTN: Managed with Amlodipine Anxiety: Managed with Lorazapam.  Distraction and redirection encouraged. Hypothyrodism: Continue Levothyroxine as ordered Routine CBC BMP TSH.  Follow up: Palliative care will continue to follow for complex medical decision making, advance care planning, and clarification of goals. Return 6 weeks or prn.Encouraged to call provider sooner with any concerns.   Family /Caregiver/Community Supports: Patient in [place] for ongoing care  HOSPICE ELIGIBILITY/DIAGNOSIS: TBD  Chief Complaint: Initial Palliative care visit  HISTORY OF PRESENT ILLNESS:  Whitney Schmidt is a 87 y.o. year old female  with multiple medical conditions including Alzheimer dementia, chronic, worsening with increasing memory loss and confusion which impairs her social interactions and independence.  Memory loss is worse as the day progresses; cueing is sometimes helpful.  History of Hypothroidism, anxiety, hypertension. History obtained from review of EMR, discussion with primary team, caregiver, family and/or Whitney Schmidt.  Review and summarization of Epic records shows history from other than patient. Rest of 10 point ROS asked and negative.    Review of lab tests/diagnostics   Facility lab records 08/10/20 eGFR 75 BUN 23, Protein 6.95 Cr .91, Potassium 4.18 Sodium 140 TSH 1.54 Results for Whitney Schmidt, Whitney Schmidt (MRN [id number]) as of 11/24/2020 10:27  Ref. Range 03/23/2020 20:10  COMPREHENSIVE METABOLIC PANEL Unknown Rpt (A)  Sodium Latest Ref Range: 135 - 145 mmol/L 134 (L)  Potassium Latest Ref Range: 3.5 - 5.1 mmol/L 3.6  Chloride Latest Ref Range: 98 - 111 mmol/L 98  CO2 Latest Ref Range: 22 - 32 mmol/L 24  Glucose Latest Ref Range: 70 - 99 mg/dL 134 (H)  BUN Latest Ref Range: 8 - 23 mg/dL 40 (H)  Creatinine Latest Ref Range: 0.44 -  1.00 mg/dL 1.13 (H)  Calcium Latest Ref Range: 8.9 - 10.3 mg/dL 8.8 (L)  Anion gap Latest Ref Range:  5 - 15  12  Alkaline Phosphatase Latest Ref Range: 38 - 126 U/L 60  Albumin Latest Ref Range: 3.5 - 5.0 g/dL 3.6  AST Latest Ref Range: 15 - 41 U/L 33  ALT Latest Ref Range: 0 - 44 U/L 22  Total Protein Latest Ref Range: 6.5 - 8.1 g/dL 7.5  Total Bilirubin Latest Ref Range: 0.3 - 1.2 mg/dL 1.1  GFR, Estimated Latest Ref Range: >60 mL/min 46 (L)    ROS General: NAD EYES: denies vision changes ENMT: denies dysphagia Cardiovascular: denies chest pain/discomfort Pulmonary: denies cough, denies SOB Abdomen: endorses good appetite, denies constipation/diarrhea GU: denies dysuria, urinary frequency MSK:  endorses weakness,  no falls reported Skin: denies rashes or wounds Neurological: denies pain, denies insomnia Psych: Endorses positive mood Heme/lymph/immuno: denies bruises, abnormal bleeding  Physical Exam: Height/Weight: 5 feet 4 inches/167 Ibs Constitutional: NAD General: Well groomed, cooperative EYES: anicteric sclera, lids intact, no discharge  ENMT: Moist mucous membrane CV: S1 S2, RRR, no LE edema Pulmonary: LCTA, no increased work of breathing, no cough, Abdomen: active BS + 4 quadrants, soft and non tender GU: no suprapubic tenderness MSK: weakness, ambulatory with rolling walker Skin: warm and dry, no rashes or wounds on visible skin Neuro:  weakness, otherwise non focal, memory loss/confusion Psych: non-anxious affect Hem/lymph/immuno: no widespread bruising   PAST MEDICAL HISTORY:  Active Ambulatory Problems    Diagnosis Date Noted   Rhabdomyolysis 03/06/2018   Dementia ([place]) 03/06/2018   Hypertension 03/06/2018   Hypothyroidism 03/06/2018   History of cardioembolic cerebrovascular accident (CVA) 03/06/2018   Pressure injury of toe, unstageable ([place]) 03/06/2018   Pressure injury of toe, stage 3 ([place]) 03/06/2018   COVID-19 virus infection 11/16/2018   Dehydration 11/16/2018   Weakness    Resolved Ambulatory Problems    Diagnosis Date Noted   No Resolved  Ambulatory Problems   Past Medical History:  Diagnosis Date   Anxiety    Memory loss    Thyroid disease     SOCIAL HX:  Social History   Tobacco Use   Smoking status: Never   Smokeless tobacco: Never  Substance Use Topics   Alcohol use: No     FAMILY HX:  Family History  Problem Relation Age of Onset   Hypertension Mother    Cancer Mother    Stroke Father    Hypertension Father       ALLERGIES:  Allergies  Allergen Reactions   Nitrofurantoin       PERTINENT MEDICATIONS:  Outpatient Encounter Medications as of 11/24/2020  Medication Sig   ALPRAZolam (XANAX) 0.25 MG tablet Take 1 tablet (0.25 mg total) by mouth 2 (two) times daily as needed for anxiety (Only give for panic attacks.  Do not give if patient is drowsy).   amLODipine (NORVASC) 2.5 MG tablet Take 1 tablet (2.5 mg total) by mouth daily.   aspirin 325 MG tablet Take 325 mg by mouth every 6 (six) hours as needed for moderate pain.    benzocaine-resorcinol (VAGISIL) 5-2 % vaginal cream Place 1 application vaginally daily as needed for itching.   donepezil (ARICEPT) 10 MG tablet Take 1 tablet (10 mg total) by mouth at bedtime.   hydrocortisone cream 1 % Apply 1 application topically daily as needed for itching.   levothyroxine (SYNTHROID) 50 MCG tablet Take 1 tablet (50 mcg total) by mouth daily before  breakfast. Take 1 tablet (50 mcg total) by moth daily at 0600   QUEtiapine (SEROQUEL) 50 MG tablet Take 1 tablet (50 mg total) by mouth at bedtime. (Patient taking differently: Take 25 mg by mouth at bedtime. )   vitamin B-12 (CYANOCOBALAMIN) 1000 MCG tablet Take 1,000 mcg by mouth daily.   No facility-administered encounter medications on file as of 11/24/2020.   Thank you for the opportunity to participate in the care of Ms. Minehart.  The palliative care team will continue to follow. Please call our office at [phone] if we can be of additional assistance.   Note: Portions of this note were generated with Geographical information systems officer. Dictation errors may occur despite best attempts at proofreading.  Teodoro Spray, NP

## 2021-02-09 ENCOUNTER — Other Ambulatory Visit: Payer: Self-pay

## 2021-02-09 ENCOUNTER — Non-Acute Institutional Stay: Payer: Medicare PPO | Admitting: Hospice

## 2021-02-09 DIAGNOSIS — Z515 Encounter for palliative care: Secondary | ICD-10-CM

## 2021-02-09 DIAGNOSIS — I10 Essential (primary) hypertension: Secondary | ICD-10-CM

## 2021-02-09 DIAGNOSIS — F0390 Unspecified dementia without behavioral disturbance: Secondary | ICD-10-CM

## 2021-02-09 NOTE — Progress Notes (Signed)
Therapist, nutritional Palliative Care Consult Note Telephone: [phone]  Fax: [phone]  PATIENT NAME: Whitney Schmidt DOB: 03-02-1927 MRN: [id number]  PRIMARY CARE PROVIDER:   Margit Hanks, MD Margit Hanks, MD No address on file  REFERRING PROVIDER: Margit Hanks, MD Margit Hanks, MD No address on file  RESPONSIBLE PARTY:  Son Contact Information     Name Relation Home Work Mobile   [place] Son [phone]  [phone]   Missy Sabins [phone]  [phone]       Visit is to build trust and highlight Palliative Medicine as specialized medical care for people living with serious illness, aimed at facilitating better quality of life through symptoms relief, assisting with advance care planning and complex medical decision making. This is a follow up visit.  RECOMMENDATIONS/PLAN:   Advance Care Planning/Code Status: Patient is a DNR  Goals of Care: Goals of care include to maximize quality of life and symptom management.  Symptom management/Plan:   Dementia: Memory loss/confusion  progressing in line with dementia disease trajectory.  Continue Aricept as ordered.  Encourage participation in activities, word search/puzzles/colorings.  Fall and Safety precautions. HTN: Managed with Amlodipine Follow up: Palliative care will continue to follow for complex medical decision making, advance care planning, and clarification of goals. Return 6 weeks or prn.Encouraged to call provider sooner with any concerns.    Family /Caregiver/Community Supports: Patient in [place] for ongoing care   HOSPICE ELIGIBILITY/DIAGNOSIS: TBD   Chief Complaint: Follow up visit   HISTORY OF PRESENT ILLNESS:  Whitney Schmidt is a 87 y.o. year old female  with multiple medical conditions including advanced dementia with progressing memory loss and decline in functional status, Hypothyroidism, anxiety, hypertension.  Patient denies pain/discomfort. Nursing  with no concerns. History obtained from review of EMR, discussion with primary team, family and/or patient. Records reviewed and summarized above. All 10 point systems reviewed and are negative except as documented in history of present illness above  Review and summarization of Epic records shows history from other than patient.   Palliative Care was asked to follow this patient o help address complex decision making in the context of advance care planning and goals of care clarification.   PHYSICAL EXAM  Height/Weight: 5 feet 4 inches/161 Ibs Constitutional: NAD General: Well groomed, cooperative EYES: anicteric sclera, lids intact, no discharge  ENMT: Moist mucous membrane CV: S1 S2, RRR, no LE edema Pulmonary: LCTA, no increased work of breathing, no cough, Abdomen: active BS + 4 quadrants, soft and non tender GU: no suprapubic tenderness MSK: weakness, ambulatory with rolling walker Skin: warm and dry, no rashes or wounds on visible skin Neuro:  weakness, otherwise non focal, memory loss/confusion Psych: non-anxious affect Hem/lymph/immuno: no widespread bruising  PERTINENT MEDICATIONS:  Outpatient Encounter Medications as of 02/09/2021  Medication Sig   ALPRAZolam (XANAX) 0.25 MG tablet Take 1 tablet (0.25 mg total) by mouth 2 (two) times daily as needed for anxiety (Only give for panic attacks.  Do not give if patient is drowsy).   amLODipine (NORVASC) 2.5 MG tablet Take 1 tablet (2.5 mg total) by mouth daily.   aspirin 325 MG tablet Take 325 mg by mouth every 6 (six) hours as needed for moderate pain.    benzocaine-resorcinol (VAGISIL) 5-2 % vaginal cream Place 1 application vaginally daily as needed for itching.   donepezil (ARICEPT) 10 MG tablet Take 1 tablet (10 mg total) by mouth at bedtime.   hydrocortisone cream 1 % Apply 1  application topically daily as needed for itching.   levothyroxine (SYNTHROID) 50 MCG tablet Take 1 tablet (50 mcg total) by mouth daily before  breakfast. Take 1 tablet (50 mcg total) by moth daily at 0600   QUEtiapine (SEROQUEL) 50 MG tablet Take 1 tablet (50 mg total) by mouth at bedtime. (Patient taking differently: Take 25 mg by mouth at bedtime. )   vitamin B-12 (CYANOCOBALAMIN) 1000 MCG tablet Take 1,000 mcg by mouth daily.   No facility-administered encounter medications on file as of 02/09/2021.    HOSPICE ELIGIBILITY/DIAGNOSIS: TBD  PAST MEDICAL HISTORY:  Past Medical History:  Diagnosis Date   Anxiety    Dementia (HCC)    Memory loss    Thyroid disease      ALLERGIES:  Allergies  Allergen Reactions   Nitrofurantoin       I spent 40 minutes providing this consultation; this includes time spent with patient/family, chart review and documentation. More than 50% of the time in this consultation was spent on counseling and coordinating communication   Thank you for the opportunity to participate in the care of Whitney Schmidt Please call our office at [phone] if we can be of additional assistance.  Note: Portions of this note were generated with Scientist, clinical (histocompatibility and immunogenetics). Dictation errors may occur despite best attempts at proofreading.  Rosaura Carpenter, NP

## 2021-03-16 ENCOUNTER — Other Ambulatory Visit: Payer: Self-pay

## 2021-03-16 ENCOUNTER — Non-Acute Institutional Stay: Payer: Medicare PPO | Admitting: Hospice

## 2021-03-16 DIAGNOSIS — I10 Essential (primary) hypertension: Secondary | ICD-10-CM

## 2021-03-16 DIAGNOSIS — F0390 Unspecified dementia without behavioral disturbance: Secondary | ICD-10-CM

## 2021-03-16 DIAGNOSIS — Z515 Encounter for palliative care: Secondary | ICD-10-CM

## 2021-03-16 NOTE — Progress Notes (Signed)
Therapist, nutritional Palliative Care Consult Note Telephone: [phone]  Fax: [phone]  PATIENT NAME: Whitney Schmidt DOB: Jun 06, 1926 MRN: [id number]  PRIMARY CARE PROVIDER:   Malissa Hippo NP  REFERRING PROVIDER: Lizabeth Leyden NP  RESPONSIBLE PARTY:  Son Contact Information     Name Relation Home Work Mobile   [place] Son [phone]  [phone]   Whitney Schmidt [phone]  [phone]       Visit is to build trust and highlight Palliative Medicine as specialized medical care for people living with serious illness, aimed at facilitating better quality of life through symptoms relief, assisting with advance care planning and complex medical decision making. This is a follow up visit.  RECOMMENDATIONS/PLAN:   Advance Care Planning/Code Status: Patient is a DNR  Goals of Care: Goals of care include to maximize quality of life and symptom management.  Symptom management/Plan:  Dementia: Memory loss/confusion at baseline.  Continue Aricept as ordered.  Encourage participation in activities, word search/puzzles/colorings.  Fall and Safety precautions. HTN: Managed with Amlodipine Follow up: Palliative care will continue to follow for complex medical decision making, advance care planning, and clarification of goals. Return 6 weeks or prn.Encouraged to call provider sooner with any concerns.    Family /Caregiver/Community Supports: Patient in AL for ongoing care   HOSPICE ELIGIBILITY/DIAGNOSIS: TBD   Chief Complaint: Follow up visit   HISTORY OF PRESENT ILLNESS:  Whitney Schmidt is a 87 y.o. year old female  with multiple medical conditions including advanced dementia with associated memory loss/confusion. She is still able to ambulate with her rolling walker, feeds self and helps in her ADLs. She has history of Hypothyroidism, anxiety, hypertension.  Patient denies pain/discomfort. Nursing with no concerns. History obtained from  review of EMR, discussion with primary team, family and/or patient. Records reviewed and summarized above. All 10 point systems reviewed and are negative except as documented in history of present illness above  Review and summarization of Epic records shows history from other than patient.   Palliative Care was asked to follow this patient to help address complex decision making in the context of advance care planning and goals of care clarification.   PHYSICAL EXAM  Constitutional: NAD General: Well groomed, cooperative EYES: anicteric sclera, lids intact, no discharge  ENMT: Moist mucous membrane CV: S1 S2, RRR, no LE edema Pulmonary: LCTA, no increased work of breathing, no cough, Abdomen: active BS + 4 quadrants, soft and non tender GU: no suprapubic tenderness MSK: weakness, ambulatory with rolling walker Skin: warm and dry, no rashes or wounds on visible skin Neuro:  weakness, otherwise non focal, memory loss/confusion Psych: non-anxious affect Hem/lymph/immuno: no widespread bruising  PERTINENT MEDICATIONS:  Outpatient Encounter Medications as of 03/16/2021  Medication Sig   ALPRAZolam (XANAX) 0.25 MG tablet Take 1 tablet (0.25 mg total) by mouth 2 (two) times daily as needed for anxiety (Only give for panic attacks.  Do not give if patient is drowsy).   amLODipine (NORVASC) 2.5 MG tablet Take 1 tablet (2.5 mg total) by mouth daily.   aspirin 325 MG tablet Take 325 mg by mouth every 6 (six) hours as needed for moderate pain.    benzocaine-resorcinol (VAGISIL) 5-2 % vaginal cream Place 1 application vaginally daily as needed for itching.   donepezil (ARICEPT) 10 MG tablet Take 1 tablet (10 mg total) by mouth at bedtime.   hydrocortisone cream 1 % Apply 1 application topically daily as needed for itching.   levothyroxine (SYNTHROID) 50 MCG  tablet Take 1 tablet (50 mcg total) by mouth daily before breakfast. Take 1 tablet (50 mcg total) by moth daily at 0600   QUEtiapine  (SEROQUEL) 50 MG tablet Take 1 tablet (50 mg total) by mouth at bedtime. (Patient taking differently: Take 25 mg by mouth at bedtime. )   vitamin B-12 (CYANOCOBALAMIN) 1000 MCG tablet Take 1,000 mcg by mouth daily.   No facility-administered encounter medications on file as of 03/16/2021.    HOSPICE ELIGIBILITY/DIAGNOSIS: TBD  PAST MEDICAL HISTORY:  Past Medical History:  Diagnosis Date   Anxiety    Dementia (HCC)    Memory loss    Thyroid disease      ALLERGIES:  Allergies  Allergen Reactions   Nitrofurantoin       I spent 40 minutes providing this consultation; this includes time spent with patient/family, chart review and documentation. More than 50% of the time in this consultation was spent on counseling and coordinating communication   Thank you for the opportunity to participate in the care of Whitney Schmidt Please call our office at [phone] if we can be of additional assistance.  Note: Portions of this note were generated with Scientist, clinical (histocompatibility and immunogenetics). Dictation errors may occur despite best attempts at proofreading.  Rosaura Carpenter, NP

## 2021-04-13 ENCOUNTER — Emergency Department (HOSPITAL_COMMUNITY): Payer: Medicare PPO

## 2021-04-13 ENCOUNTER — Emergency Department (HOSPITAL_COMMUNITY)
Admission: EM | Admit: 2021-04-13 | Discharge: 2021-04-14 | Disposition: A | Discharge status: Home / Self Care | Payer: Medicare PPO | Attending: Emergency Medicine | Admitting: Emergency Medicine

## 2021-04-13 DIAGNOSIS — D649 Anemia, unspecified: Secondary | ICD-10-CM | POA: Diagnosis not present

## 2021-04-13 DIAGNOSIS — Z20822 Contact with and (suspected) exposure to covid-19: Secondary | ICD-10-CM | POA: Insufficient documentation

## 2021-04-13 DIAGNOSIS — E871 Hypo-osmolality and hyponatremia: Secondary | ICD-10-CM

## 2021-04-13 DIAGNOSIS — Z79899 Other long term (current) drug therapy: Secondary | ICD-10-CM | POA: Diagnosis not present

## 2021-04-13 DIAGNOSIS — E039 Hypothyroidism, unspecified: Secondary | ICD-10-CM | POA: Diagnosis not present

## 2021-04-13 DIAGNOSIS — S72111A Displaced fracture of greater trochanter of right femur, initial encounter for closed fracture: Secondary | ICD-10-CM | POA: Diagnosis not present

## 2021-04-13 DIAGNOSIS — N390 Urinary tract infection, site not specified: Secondary | ICD-10-CM

## 2021-04-13 DIAGNOSIS — F0390 Unspecified dementia without behavioral disturbance: Secondary | ICD-10-CM | POA: Diagnosis not present

## 2021-04-13 DIAGNOSIS — J101 Influenza due to other identified influenza virus with other respiratory manifestations: Secondary | ICD-10-CM | POA: Diagnosis not present

## 2021-04-13 DIAGNOSIS — S79911A Unspecified injury of right hip, initial encounter: Secondary | ICD-10-CM | POA: Diagnosis present

## 2021-04-13 DIAGNOSIS — Y92129 Unspecified place in nursing home as the place of occurrence of the external cause: Secondary | ICD-10-CM | POA: Insufficient documentation

## 2021-04-13 DIAGNOSIS — W19XXXA Unspecified fall, initial encounter: Secondary | ICD-10-CM | POA: Diagnosis not present

## 2021-04-13 DIAGNOSIS — Z8616 Personal history of COVID-19: Secondary | ICD-10-CM | POA: Insufficient documentation

## 2021-04-13 DIAGNOSIS — I10 Essential (primary) hypertension: Secondary | ICD-10-CM | POA: Insufficient documentation

## 2021-04-13 LAB — CBC WITH DIFFERENTIAL/PLATELET
Abs Immature Granulocytes: 0.05 10*3/uL (ref 0.00–0.07)
Basophils Absolute: 0 10*3/uL (ref 0.0–0.1)
Basophils Relative: 0 %
Eosinophils Absolute: 0 10*3/uL (ref 0.0–0.5)
Eosinophils Relative: 0 %
HCT: 36.8 % (ref 36.0–46.0)
Hemoglobin: 11.6 g/dL — ABNORMAL LOW (ref 12.0–15.0)
Immature Granulocytes: 1 %
Lymphocytes Relative: 9 %
Lymphs Abs: 1 10*3/uL (ref 0.7–4.0)
MCH: 30.8 pg (ref 26.0–34.0)
MCHC: 31.5 g/dL (ref 30.0–36.0)
MCV: 97.6 fL (ref 80.0–100.0)
Monocytes Absolute: 0.9 10*3/uL (ref 0.1–1.0)
Monocytes Relative: 9 %
Neutro Abs: 8.7 10*3/uL — ABNORMAL HIGH (ref 1.7–7.7)
Neutrophils Relative %: 81 %
Platelets: 165 10*3/uL (ref 150–400)
RBC: 3.77 MIL/uL — ABNORMAL LOW (ref 3.87–5.11)
RDW: 14.8 % (ref 11.5–15.5)
WBC: 10.6 10*3/uL — ABNORMAL HIGH (ref 4.0–10.5)
nRBC: 0 % (ref 0.0–0.2)

## 2021-04-13 LAB — URINALYSIS, MICROSCOPIC (REFLEX)

## 2021-04-13 LAB — BASIC METABOLIC PANEL
Anion gap: 9 (ref 5–15)
BUN: 20 mg/dL (ref 8–23)
CO2: 23 mmol/L (ref 22–32)
Calcium: 8.8 mg/dL — ABNORMAL LOW (ref 8.9–10.3)
Chloride: 101 mmol/L (ref 98–111)
Creatinine, Ser: 0.94 mg/dL (ref 0.44–1.00)
GFR, Estimated: 57 mL/min — ABNORMAL LOW (ref >60)
Glucose, Bld: 117 mg/dL — ABNORMAL HIGH (ref 70–99)
Potassium: 3.7 mmol/L (ref 3.5–5.1)
Sodium: 133 mmol/L — ABNORMAL LOW (ref 135–145)

## 2021-04-13 LAB — URINALYSIS, ROUTINE W REFLEX MICROSCOPIC
Bilirubin Urine: NEGATIVE
Glucose, UA: NEGATIVE mg/dL
Ketones, ur: NEGATIVE mg/dL
Leukocytes,Ua: NEGATIVE
Nitrite: POSITIVE — AB
Protein, ur: 30 mg/dL — AB
Specific Gravity, Urine: 1.02 (ref 1.005–1.030)
pH: 8.5 — ABNORMAL HIGH (ref 5.0–8.0)

## 2021-04-13 LAB — ABO/RH: ABO/RH(D): AB POS

## 2021-04-13 LAB — RESP PANEL BY RT-PCR (FLU A&B, COVID) ARPGX2
Influenza A by PCR: POSITIVE — AB
Influenza B by PCR: NEGATIVE
SARS Coronavirus 2 by RT PCR: NEGATIVE

## 2021-04-13 LAB — TYPE AND SCREEN
ABO/RH(D): AB POS
Antibody Screen: NEGATIVE

## 2021-04-13 LAB — PROTIME-INR
INR: 1.1 (ref 0.8–1.2)
Prothrombin Time: 14.5 seconds (ref 11.4–15.2)

## 2021-04-13 LAB — LACTIC ACID, PLASMA: Lactic Acid, Venous: 1.5 mmol/L (ref 0.5–1.9)

## 2021-04-13 MED ORDER — OSELTAMIVIR PHOSPHATE 75 MG PO CAPS: 75.0000 mg | ORAL_CAPSULE | Freq: Two times a day (BID) | ORAL | 0 refills | Status: DC

## 2021-04-13 MED ORDER — LACTATED RINGERS IV BOLUS
1000.0000 mL | Freq: Once | INTRAVENOUS | Status: AC
  Administered 2021-04-13: 11:00:00 1000 mL via INTRAVENOUS

## 2021-04-13 MED ORDER — CEPHALEXIN 250 MG PO CAPS
500.0000 mg | ORAL_CAPSULE | Freq: Once | ORAL | Status: AC
  Administered 2021-04-13: 16:00:00 500 mg via ORAL
  Filled 2021-04-13: qty 2

## 2021-04-13 MED ORDER — ACETAMINOPHEN 325 MG PO TABS
650.0000 mg | ORAL_TABLET | Freq: Four times a day (QID) | ORAL | Status: DC | PRN
  Administered 2021-04-13 – 2021-04-14 (×3): 650 mg via ORAL
  Filled 2021-04-13 (×3): qty 2

## 2021-04-13 MED ORDER — OSELTAMIVIR PHOSPHATE 75 MG PO CAPS
75.0000 mg | ORAL_CAPSULE | Freq: Once | ORAL | Status: AC
  Administered 2021-04-13: 15:00:00 75 mg via ORAL
  Filled 2021-04-13: qty 1

## 2021-04-13 MED ORDER — CEPHALEXIN 500 MG PO CAPS: 500.0000 mg | ORAL_CAPSULE | Freq: Three times a day (TID) | ORAL | 0 refills | Status: AC

## 2021-04-13 NOTE — ED Notes (Signed)
Eileen Stanford (Daughter) of Whitney Schmidt in 39 called asking for an update. No one called the family. [phone]

## 2021-04-13 NOTE — ED Notes (Signed)
[facility] called  

## 2021-04-13 NOTE — ED Notes (Signed)
Pt returned back from CT.

## 2021-04-13 NOTE — ED Notes (Signed)
RN called pt's daughter with update. RN called [facility] for report, spoke to [employer]. RN and NT changed pt, incontinent of urine. RN ordered dinner tray as [facility] transport very behind at this time. Pt resting in stretcher, no distress noted, call light in reach.

## 2021-04-13 NOTE — Discharge Instructions (Addendum)
Apply ice for 30 minutes at a time, 4 times a day.  Continue taking acetaminophen as needed for pain.  Weightbearing as tolerated, but anticipate you will not be able to bear weight for at least 3 weeks.

## 2021-04-13 NOTE — ED Provider Notes (Signed)
[facility] EMERGENCY DEPARTMENT Provider Note   CSN: [id number] Arrival date & time: 04/13/21  0053     History Chief Complaint  Patient presents with   Fall    Whitney Schmidt is a 87 y.o. female.  The history is provided by the nursing home. The history is limited by the condition of the patient (Dementia).  Fall She has a history of dementia, hypertension, and was transferred from a nursing home after an unwitnessed fall with concerns for injury to her right hip.  Patient is not able to give any history whatsoever.   Past Medical History:  Diagnosis Date   Anxiety    Dementia (HCC)    Memory loss    Thyroid disease     Patient Active Problem List   Diagnosis Date Noted   COVID-19 virus infection 11/16/2018   Dehydration 11/16/2018   Weakness    Rhabdomyolysis 03/06/2018   Dementia (HCC) 03/06/2018   Hypertension 03/06/2018   Hypothyroidism 03/06/2018   History of cardioembolic cerebrovascular accident (CVA) 03/06/2018   Pressure injury of toe, unstageable (HCC) 03/06/2018   Pressure injury of toe, stage 3 (HCC) 03/06/2018    Past Surgical History:  Procedure Laterality Date   ABDOMINAL HYSTERECTOMY     ABDOMINAL HYSTERECTOMY     ANTERIOR AND POSTERIOR VAGINAL REPAIR     APPENDECTOMY     FOOT SURGERY     KNEE SURGERY     TONSILLECTOMY     URETHRAL SLING       OB History   No obstetric history on file.     Family History  Problem Relation Age of Onset   Hypertension Mother    Cancer Mother    Stroke Father    Hypertension Father     Social History   Tobacco Use   Smoking status: Never   Smokeless tobacco: Never  Substance Use Topics   Alcohol use: No   Drug use: No    Home Medications Prior to Admission medications   Medication Sig Start Date End Date Taking? Authorizing Provider  ALPRAZolam (XANAX) 0.25 MG tablet Take 1 tablet (0.25 mg total) by mouth 2 (two) times daily as needed for anxiety (Only give for panic  attacks.  Do not give if patient is drowsy). 11/17/18   Hollice Espy, MD  amLODipine (NORVASC) 2.5 MG tablet Take 1 tablet (2.5 mg total) by mouth daily. 03/23/18   Margit Hanks, MD  aspirin 325 MG tablet Take 325 mg by mouth every 6 (six) hours as needed for moderate pain.     [provider]  benzocaine-resorcinol (VAGISIL) 5-2 % vaginal cream Place 1 application vaginally daily as needed for itching.    [provider]  donepezil (ARICEPT) 10 MG tablet Take 1 tablet (10 mg total) by mouth at bedtime. 11/17/18   Hollice Espy, MD  hydrocortisone cream 1 % Apply 1 application topically daily as needed for itching.    [provider]  levothyroxine (SYNTHROID) 50 MCG tablet Take 1 tablet (50 mcg total) by mouth daily before breakfast. Take 1 tablet (50 mcg total) by moth daily at 0600 11/17/18   Hollice Espy, MD  QUEtiapine (SEROQUEL) 50 MG tablet Take 1 tablet (50 mg total) by mouth at bedtime. Patient taking differently: Take 25 mg by mouth at bedtime.  03/23/18   Margit Hanks, MD  vitamin B-12 (CYANOCOBALAMIN) 1000 MCG tablet Take 1,000 mcg by mouth daily.    [provider]  Allergies    Nitrofurantoin  Review of Systems   Review of Systems  Unable to perform ROS: Dementia   Physical Exam Updated Vital Signs BP 139/74    Pulse 95    Temp 97.7 F (36.5 C) (Tympanic)    Resp 16    SpO2 99%   Physical Exam Vitals and nursing note reviewed.  87 year old female, resting comfortably and in no acute distress. Vital signs are normal. Oxygen saturation is 99%, which is normal. Head is normocephalic and atraumatic. PERRLA, EOMI. Oropharynx is clear. Neck is nontender and supple without adenopathy or JVD. Back is nontender and there is no CVA tenderness. Lungs are clear without rales, wheezes, or rhonchi. Chest is nontender. Heart has regular rate and rhythm without murmur. Abdomen is soft, flat, nontender without masses or  hepatosplenomegaly and peristalsis is normoactive. Extremities: There is tenderness to palpation over the lateral aspect of the right hip and over the anterior pelvic brim.  Pain is elicited with range of motion of the right hip.  There is no shortening or internal or external rotation. Skin is warm and dry without rash. Neurologic: Awake and alert, oriented to person but not place or time.  Cranial nerves are grossly intact.  Moves arms symmetrically, decreased movement of the right leg compared with the left, but most likely secondary to pain.  ED Results / Procedures / Treatments   Labs (all labs ordered are listed, but only abnormal results are displayed) Labs Reviewed  BASIC METABOLIC PANEL - Abnormal; Notable for the following components:      Result Value   Sodium 133 (*)    Glucose, Bld 117 (*)    Calcium 8.8 (*)    GFR, Estimated 57 (*)    All other components within normal limits  CBC WITH DIFFERENTIAL/PLATELET - Abnormal; Notable for the following components:   WBC 10.6 (*)    RBC 3.77 (*)    Hemoglobin 11.6 (*)    Neutro Abs 8.7 (*)    All other components within normal limits  PROTIME-INR  TYPE AND SCREEN  ABO/RH    EKG None  Radiology CT Hip Right Wo Contrast  Result Date: 04/13/2021 CLINICAL DATA:  Hip trauma, fracture suspected. EXAM: CT OF THE RIGHT HIP WITHOUT CONTRAST TECHNIQUE: Multidetector CT imaging of the right hip was performed according to the standard protocol. Multiplanar CT image reconstructions were also generated. COMPARISON:  04/13/2021. FINDINGS: Bones/Joint/Cartilage There is a displaced comminuted fracture of the greater trochanter without extension into the femoral neck or intertrochanteric region. There is no dislocation at the right hip. The remaining bony structures are intact. Ligaments Suboptimally assessed by CT. Muscles and Tendons No large intramuscular hematoma. Soft tissues Soft tissue swelling is present over the intertrochanteric  region on the right. IMPRESSION: Comminuted displaced fracture of the greater trochanter. There is no fracture of the right femoral neck or intertrochanteric space. Electronically Signed   By: Thornell Sartorius M.D.   On: 04/13/2021 03:09   DG Chest Portable 1 View  Result Date: 04/13/2021 CLINICAL DATA:  Unwitnessed fall, right hip pain. EXAM: PORTABLE CHEST 1 VIEW COMPARISON:  11/15/2020. FINDINGS: The heart size and mediastinal contours are within normal limits. There is atherosclerotic calcification of the aorta. Both lungs are clear. There is dextroscoliosis with degenerative changes of the thoracic spine. IMPRESSION: No acute cardiopulmonary process. Electronically Signed   By: Thornell Sartorius M.D.   On: 04/13/2021 01:36   DG Hip Port Unilat W or Wo Pelvis  1 View Right  Result Date: 04/13/2021 CLINICAL DATA:  Fall, right hip pain. EXAM: DG HIP (WITH OR WITHOUT PELVIS) 1V PORT RIGHT COMPARISON:  None. FINDINGS: There is diffusely decreased mineralization of the bones. There is a mildly displaced fracture of the greater trochanter on the with lucencies containing into the intertrochanteric region. There is no dislocation. Mild degenerative changes are noted at the hip and lower lumbar spine. IMPRESSION: Mildly displaced fracture of the greater trochanter. Lucency extends into the intertrochanteric region and intertrochanteric fracture can not be completely excluded. CT is recommended for further evaluation. Electronically Signed   By: Thornell Sartorius M.D.   On: 04/13/2021 01:34    Procedures Procedures   Medications Ordered in ED Medications - No data to display  ED Course  I have reviewed the triage vital signs and the nursing notes.  Pertinent labs & imaging results that were available during my care of the patient were reviewed by me and considered in my medical decision making (see chart for details).    MDM Rules/Calculators/A&P                         Fall with right hip injury.  X-rays  show a fracture of the greater trochanter with some question of associated intertrochanteric fracture.  She will be sent for CT for further evaluation.  Old records are reviewed, and she has no relevant past visits.  CT scan confirms fracture of the greater trochanter with any intertrochanteric or femoral neck fractures.  This is normally a nonoperative fracture treated with limited weightbearing.  I discussed the case with Dr. Susa Simmonds, on-call for orthopedics, who has reviewed the images and agrees that this is a nonoperative case and needs to be treated with weightbearing as tolerated.  Labs obtained were significant for mild hyponatremia which is not felt to be clinically significant, and stable anemia.  She is discharged to return to her skilled nursing facility.  Final Clinical Impression(s) / ED Diagnoses Final diagnoses:  Fall at nursing home, initial encounter  Closed avulsion fracture of greater trochanter of femur, right, initial encounter (HCC)  Hyponatremia  Normochromic normocytic anemia    Rx / DC Orders ED Discharge Orders     None        Dione Booze, MD 04/13/21 [phone]

## 2021-04-13 NOTE — ED Notes (Signed)
This RN went in to assess pt, as monitoring wires were seen on the floor. IV, all monitoring equipment was pulled off by pt. Pt is very HOH, and we are communicating by writing with each other. Pt is agitated now, confused, stating, "we messed with the wrong one". Reminded she is in the hospital due to hip injury, and she denies this. States she wants to go back to sleep and be left alone. Door and curtain open, continuing to monitor closely

## 2021-04-13 NOTE — ED Notes (Signed)
Still waiting [facility] to transport home

## 2021-04-13 NOTE — ED Notes (Signed)
Patient transported to CT 

## 2021-04-13 NOTE — ED Provider Notes (Signed)
°  Physical Exam  BP (!) 134/51 (BP Location: Right Arm)    Pulse (!) 104    Temp 97.6 F (36.4 C) (Oral)    Resp (!) 22    SpO2 94%   Physical Exam  ED Course/Procedures     Procedures  MDM   Patient awaiting [facility] for transportation at time of transfer of care.  Prior to transfer, found to have a fever of 101, per nursing 103.5 rectally.   Had chest x-ray completed earlier which showed no evidence of infection.  Will evaluate with urinalysis, COVID and flu testing.  Given unwitnessed fall and history of dementia, ordered CT head.  CT head without acute findings. Lactic acid WNL.  Attempted to call [facility] at [facility] for more information, however unable to get in touch with nurse caring for patient at the facility.  Her influenza testing is positive, she is given a dose of Tamiflu.  Urinalysis shows signs of UTI.   She is hemodynamically stable, feel she is appropriate for outpatient treatment of influenza and UTI. Given rx for tamiflu and keflex.         Alvira Monday, MD 04/13/21 1525

## 2021-04-15 LAB — URINE CULTURE: Culture: >=100000 — AB

## 2021-04-16 ENCOUNTER — Telehealth: Payer: Self-pay | Admitting: Emergency Medicine

## 2021-04-16 NOTE — Telephone Encounter (Signed)
Post ED Visit - Positive Culture Follow-up  Culture report reviewed by antimicrobial stewardship pharmacist: [facility] []  , Pharm.D. []  Enzo Bi, Pharm.D., BCPS AQ-ID []  , Pharm.D., BCPS []  Celedonio Miyamoto, .D., BCPS []  [place], .D., BCPS, AAHIVP []  Georgina Pillion, Pharm.D., BCPS, AAHIVP []  [address], PharmD, BCPS []  , PharmD, BCPS []  [place], PharmD, BCPS []  [address], PharmD []  , PharmD, BCPS []  Estella Husk, PharmD  Pharmacy Team []  Lysle Pearl, PharmD []  , PharmD []  Phillips Climes, PharmD []  , Rph []  Agapito Games) , PharmD []  Verlan Friends, PharmD []  , PharmD []  Mervyn Gay, PharmD []  , PharmD []  Vinnie Level, PharmD []  [facility], PharmD []  , PharmD []  Len Childs, PharmD PharmD   Positive urine culture Treated with cephalexin, organism sensitive to the same and no further patient follow-up is required at this time.  Greer Pickerel 04/16/2021, 11:48 AM

## 2021-04-18 LAB — CULTURE, BLOOD (ROUTINE X 2)
Culture: NO GROWTH
Special Requests: ADEQUATE

## 2021-05-11 ENCOUNTER — Other Ambulatory Visit: Payer: Self-pay

## 2021-05-11 ENCOUNTER — Non-Acute Institutional Stay: Payer: Medicare PPO | Admitting: Hospice

## 2021-05-11 DIAGNOSIS — F0390 Unspecified dementia without behavioral disturbance: Secondary | ICD-10-CM

## 2021-05-11 DIAGNOSIS — Z515 Encounter for palliative care: Secondary | ICD-10-CM

## 2021-05-11 DIAGNOSIS — R609 Edema, unspecified: Secondary | ICD-10-CM

## 2021-05-11 DIAGNOSIS — I10 Essential (primary) hypertension: Secondary | ICD-10-CM

## 2021-05-11 NOTE — Progress Notes (Signed)
Therapist, nutritional Palliative Care Consult Note Telephone: [phone]  Fax: [phone]  PATIENT NAME: Whitney Schmidt DOB: 1926-06-22 MRN: [id number]  PRIMARY CARE PROVIDER:   Malissa Hippo NP  REFERRING PROVIDER: Lizabeth Leyden NP  RESPONSIBLE PARTY:  Son Contact Information     Name Relation Home Work Mobile   [place] Son [phone]  [phone]   Missy Sabins [phone]  [phone]       Visit is to build trust and highlight Palliative Medicine as specialized medical care for people living with serious illness, aimed at facilitating better quality of life through symptoms relief, assisting with advance care planning and complex medical decision making. This is a follow up visit.  RECOMMENDATIONS/PLAN:   Advance Care Planning/Code Status: Patient is a DNR  Goals of Care: Goals of care include to maximize quality of life and symptom management.  Symptom management/Plan:  Dementia: memory loss/confusion at baseline, decreasing mobility.Continue Aricept as ordered.  Encourage participation in activities, word search/puzzles/colorings.  Fall and Safety precautions. HTN: Managed with Amlodipine Encouraged to elevate BLE for trace edema noted today; patient was sitting for long. Monitor closely.  Follow up: Palliative care will continue to follow for complex medical decision making, advance care planning, and clarification of goals. Return 6 weeks or prn.Encouraged to call provider sooner with any concerns.    Family /Caregiver/Community Supports: Patient in [place] for ongoing care   HOSPICE ELIGIBILITY/DIAGNOSIS: TBD   Chief Complaint: Follow up visit   HISTORY OF PRESENT ILLNESS:  Whitney Schmidt is a 88 y.o. year old female  with multiple medical conditions including advanced dementia , Hypothyroidism, anxiety, hypertension.  Patient denies pain/discomfort. Nursing with no concerns. History obtained from review of EMR, discussion  with primary team, family and/or patient. Records reviewed and summarized above. All 10 point systems reviewed and are negative except as documented in history of present illness above  Review and summarization of Epic records shows history from other than patient.   Palliative Care was asked to follow this patient to help address complex decision making in the context of advance care planning and goals of care clarification.   PHYSICAL EXAM  Constitutional: NAD General: Well groomed, cooperative EYES: anicteric sclera, lids intact, no discharge  ENMT: Moist mucous membrane CV: S1 S2, RRR, trace edema to BLE Pulmonary: LCTA, no increased work of breathing, no cough, Abdomen: active BS + 4 quadrants, soft and non tender GU: no suprapubic tenderness MSK: weakness, ambulatory with rolling walker Skin: warm and dry, no rashes or wounds on visible skin Neuro:  weakness, otherwise non focal, memory loss/confusion Psych: non-anxious affect Hem/lymph/immuno: no widespread bruising  PERTINENT MEDICATIONS:  Outpatient Encounter Medications as of 05/11/2021  Medication Sig   acetaminophen (TYLENOL) 325 MG tablet Take 650 mg by mouth every 6 (six) hours as needed for moderate pain or headache.   acetaminophen (TYLENOL) 500 MG tablet Take 500 mg by mouth 3 (three) times daily.   ALPRAZolam (XANAX) 0.25 MG tablet Take 1 tablet (0.25 mg total) by mouth 2 (two) times daily as needed for anxiety (Only give for panic attacks.  Do not give if patient is drowsy). (Patient not taking: Reported on 04/13/2021)   Amino Acids-Protein Hydrolys (FEEDING SUPPLEMENT, PRO-STAT SUGAR FREE 64,) LIQD Take 30 mLs by mouth daily.   amLODipine (NORVASC) 2.5 MG tablet Take 1 tablet (2.5 mg total) by mouth daily.   aspirin 325 MG EC tablet Take 325 mg by mouth every 6 (six) hours as needed for  pain.   aspirin 325 MG tablet Take 325 mg by mouth daily.   benzocaine-resorcinol (VAGISIL) 5-2 % vaginal cream Place 1 application  vaginally daily as needed for itching.   bimatoprost (LUMIGAN) 0.01 % SOLN 1 drop at bedtime.   donepezil (ARICEPT) 10 MG tablet Take 1 tablet (10 mg total) by mouth at bedtime.   Hydrocortisone-Aloe Vera (HYDROCORTISONE-ALOE EX) Apply 1 application topically daily as needed (itching).   levothyroxine (SYNTHROID) 50 MCG tablet Take 1 tablet (50 mcg total) by mouth daily before breakfast. Take 1 tablet (50 mcg total) by moth daily at 0600   loperamide (IMODIUM A-D) 2 MG tablet Take 2 mg by mouth 4 (four) times daily as needed for diarrhea or loose stools.   LORazepam (ATIVAN) 0.5 MG tablet Take 0.5 mg by mouth 2 (two) times daily as needed for anxiety (agitation).   Multiple Vitamins-Minerals (MULTIVITAMIN WITH MINERALS) tablet Take 1 tablet by mouth daily.   oseltamivir (TAMIFLU) 75 MG capsule Take 1 capsule (75 mg total) by mouth every 12 (twelve) hours.   POVIDONE-IODINE EX Apply 1 application topically See admin instructions. Paint stage 3 PU to left 5th metatarsal head and  right 5th metatarsal head with betadine and cover with foam dressing qd.(Home health)   QUEtiapine (SEROQUEL) 50 MG tablet Take 1 tablet (50 mg total) by mouth at bedtime.   vitamin B-12 (CYANOCOBALAMIN) 250 MCG tablet Take 250 mcg by mouth daily.   No facility-administered encounter medications on file as of 05/11/2021.    HOSPICE ELIGIBILITY/DIAGNOSIS: TBD  PAST MEDICAL HISTORY:  Past Medical History:  Diagnosis Date   Anxiety    Dementia (HCC)    Memory loss    Thyroid disease      ALLERGIES:  Allergies  Allergen Reactions   Nitrofurantoin       I spent 45 minutes providing this consultation; this includes time spent with patient/family, chart review and documentation. More than 50% of the time in this consultation was spent on counseling and coordinating communication   Thank you for the opportunity to participate in the care of Kaliana Albino Please call our office at [phone] if we can be of additional  assistance.  Note: Portions of this note were generated with Scientist, clinical (histocompatibility and immunogenetics). Dictation errors may occur despite best attempts at proofreading.  Rosaura Carpenter, NP

## 2021-07-08 ENCOUNTER — Emergency Department (HOSPITAL_COMMUNITY): Payer: Medicare PPO

## 2021-07-08 ENCOUNTER — Inpatient Hospital Stay (HOSPITAL_COMMUNITY)
Admission: EM | Admit: 2021-07-08 | Discharge: 2021-07-14 | DRG: 871 | Disposition: A | Discharge status: Hospice | Payer: Medicare PPO | Source: Skilled Nursing Facility | Attending: Internal Medicine | Admitting: Internal Medicine

## 2021-07-08 ENCOUNTER — Encounter (HOSPITAL_COMMUNITY): Payer: Self-pay

## 2021-07-08 ENCOUNTER — Other Ambulatory Visit: Payer: Self-pay

## 2021-07-08 DIAGNOSIS — Z90710 Acquired absence of both cervix and uterus: Secondary | ICD-10-CM

## 2021-07-08 DIAGNOSIS — Z809 Family history of malignant neoplasm, unspecified: Secondary | ICD-10-CM

## 2021-07-08 DIAGNOSIS — N390 Urinary tract infection, site not specified: Secondary | ICD-10-CM | POA: Diagnosis present

## 2021-07-08 DIAGNOSIS — I10 Essential (primary) hypertension: Secondary | ICD-10-CM | POA: Diagnosis present

## 2021-07-08 DIAGNOSIS — F02811 Dementia in other diseases classified elsewhere, unspecified severity, with agitation: Secondary | ICD-10-CM | POA: Diagnosis present

## 2021-07-08 DIAGNOSIS — L89151 Pressure ulcer of sacral region, stage 1: Secondary | ICD-10-CM | POA: Diagnosis present

## 2021-07-08 DIAGNOSIS — A4151 Sepsis due to Escherichia coli [E. coli]: Principal | ICD-10-CM | POA: Diagnosis present

## 2021-07-08 DIAGNOSIS — A419 Sepsis, unspecified organism: Secondary | ICD-10-CM | POA: Diagnosis present

## 2021-07-08 DIAGNOSIS — Z1624 Resistance to multiple antibiotics: Secondary | ICD-10-CM | POA: Diagnosis present

## 2021-07-08 DIAGNOSIS — E876 Hypokalemia: Secondary | ICD-10-CM | POA: Diagnosis present

## 2021-07-08 DIAGNOSIS — D6489 Other specified anemias: Secondary | ICD-10-CM | POA: Diagnosis present

## 2021-07-08 DIAGNOSIS — Z79890 Hormone replacement therapy (postmenopausal): Secondary | ICD-10-CM

## 2021-07-08 DIAGNOSIS — L8990 Pressure ulcer of unspecified site, unspecified stage: Secondary | ICD-10-CM | POA: Diagnosis present

## 2021-07-08 DIAGNOSIS — Z823 Family history of stroke: Secondary | ICD-10-CM

## 2021-07-08 DIAGNOSIS — G309 Alzheimer's disease, unspecified: Secondary | ICD-10-CM | POA: Diagnosis present

## 2021-07-08 DIAGNOSIS — R102 Pelvic and perineal pain: Secondary | ICD-10-CM

## 2021-07-08 DIAGNOSIS — Z8673 Personal history of transient ischemic attack (TIA), and cerebral infarction without residual deficits: Secondary | ICD-10-CM

## 2021-07-08 DIAGNOSIS — Z7982 Long term (current) use of aspirin: Secondary | ICD-10-CM

## 2021-07-08 DIAGNOSIS — L03115 Cellulitis of right lower limb: Secondary | ICD-10-CM | POA: Diagnosis present

## 2021-07-08 DIAGNOSIS — Z20822 Contact with and (suspected) exposure to covid-19: Secondary | ICD-10-CM | POA: Diagnosis present

## 2021-07-08 DIAGNOSIS — E039 Hypothyroidism, unspecified: Secondary | ICD-10-CM | POA: Diagnosis present

## 2021-07-08 DIAGNOSIS — Z515 Encounter for palliative care: Secondary | ICD-10-CM

## 2021-07-08 DIAGNOSIS — Z8249 Family history of ischemic heart disease and other diseases of the circulatory system: Secondary | ICD-10-CM

## 2021-07-08 DIAGNOSIS — L89892 Pressure ulcer of other site, stage 2: Secondary | ICD-10-CM | POA: Diagnosis present

## 2021-07-08 DIAGNOSIS — S72101A Unspecified trochanteric fracture of right femur, initial encounter for closed fracture: Secondary | ICD-10-CM | POA: Insufficient documentation

## 2021-07-08 DIAGNOSIS — Z79899 Other long term (current) drug therapy: Secondary | ICD-10-CM

## 2021-07-08 DIAGNOSIS — Z66 Do not resuscitate: Secondary | ICD-10-CM | POA: Diagnosis present

## 2021-07-08 DIAGNOSIS — D649 Anemia, unspecified: Secondary | ICD-10-CM

## 2021-07-08 DIAGNOSIS — L0390 Cellulitis, unspecified: Secondary | ICD-10-CM | POA: Diagnosis present

## 2021-07-08 DIAGNOSIS — F0390 Unspecified dementia without behavioral disturbance: Secondary | ICD-10-CM | POA: Diagnosis present

## 2021-07-08 DIAGNOSIS — R509 Fever, unspecified: Principal | ICD-10-CM

## 2021-07-08 DIAGNOSIS — I480 Paroxysmal atrial fibrillation: Secondary | ICD-10-CM | POA: Diagnosis present

## 2021-07-08 DIAGNOSIS — L89613 Pressure ulcer of right heel, stage 3: Secondary | ICD-10-CM | POA: Diagnosis present

## 2021-07-08 MED ORDER — ONDANSETRON HCL 4 MG/2ML IJ SOLN
4.0000 mg | Freq: Once | INTRAMUSCULAR | Status: AC
  Administered 2021-07-09: 4 mg via INTRAVENOUS
  Filled 2021-07-08: qty 2

## 2021-07-08 MED ORDER — MORPHINE SULFATE (PF) 2 MG/ML IV SOLN
2.0000 mg | Freq: Once | INTRAVENOUS | Status: AC
  Administered 2021-07-09: 2 mg via INTRAVENOUS
  Filled 2021-07-08 (×2): qty 1

## 2021-07-08 NOTE — ED Provider Notes (Signed)
?[facility] DEPT ?Provider Note ? ? ?CSN: [id number] ?Arrival date & time: 07/08/21  2100 ? ?  ? ?History ? ?Chief Complaint  ?Patient presents with  ? Shoulder Pain  ? ? ?Whitney Schmidt is a 88 y.o. female with history of advanced Alzheimer's dementia who presents from [facility] assisted [facility] with concern for left shoulder pain, tailbone pain, and apparent discomfort.  Staff at facility denies any injury or fall. ? ?Per EMS patient seemed very uncomfortable.  Level 5 caveat due to patient's underlying advanced dementia. ? ?And I personally reviewed this patient's medical records.  She has history of CVA, dementia, hypertension, hypothyroidism; she is not anticoagulated.  ? ?HPI ? ?  ? ?Home Medications ?Prior to Admission medications   ?Medication Sig Start Date End Date Taking? Authorizing Provider  ?acetaminophen (TYLENOL) 325 MG tablet Take 650 mg by mouth every 6 (six) hours as needed for moderate pain or headache.   Yes [provider]  ?acetaminophen (TYLENOL) 500 MG tablet Take 500 mg by mouth 3 (three) times daily.   Yes [provider]  ?amLODipine (NORVASC) 2.5 MG tablet Take 1 tablet (2.5 mg total) by mouth daily. 03/23/18  Yes Hennie Duos, MD  ?aspirin 325 MG EC tablet Take 325 mg by mouth every 6 (six) hours as needed for pain.   Yes [provider]  ?aspirin 325 MG tablet Take 325 mg by mouth daily.   Yes [provider]  ?bimatoprost (LUMIGAN) 0.01 % SOLN 1 drop at bedtime.   Yes [provider]  ?donepezil (ARICEPT) 10 MG tablet Take 1 tablet (10 mg total) by mouth at bedtime. 11/17/18  Yes Annita Brod, MD  ?levothyroxine (SYNTHROID) 50 MCG tablet Take 1 tablet (50 mcg total) by mouth daily before breakfast. Take 1 tablet (50 mcg total) by moth daily at 0600 11/17/18  Yes Annita Brod, MD  ?Nutritional Supplements (ARGINAID PO) Take 4.5 g by mouth 2 (two) times daily. Mix 1 packet with fluids prior to giving    Yes [provider]  ?QUEtiapine (SEROQUEL) 50 MG tablet Take 1 tablet (50 mg total) by mouth at bedtime. 03/23/18  Yes Hennie Duos, MD  ?vitamin B-12 (CYANOCOBALAMIN) 250 MCG tablet Take 250 mcg by mouth daily.   Yes [provider]  ?Amino Acids-Protein Hydrolys (FEEDING SUPPLEMENT, PRO-STAT SUGAR FREE 64,) LIQD Take 30 mLs by mouth daily. ?Patient not taking: Reported on 07/09/2021    [provider]  ?benzocaine-resorcinol (VAGISIL) 5-2 % vaginal cream Place 1 application vaginally daily as needed for itching. ?Patient not taking: Reported on 07/09/2021    [provider]  ?Hydrocortisone-Aloe Vera (HYDROCORTISONE-ALOE EX) Apply 1 application topically daily as needed (itching). ?Patient not taking: Reported on 07/09/2021    [provider]  ?loperamide (IMODIUM A-D) 2 MG tablet Take 2 mg by mouth 4 (four) times daily as needed for diarrhea or loose stools. ?Patient not taking: Reported on 07/09/2021    [provider]  ?LORazepam (ATIVAN) 0.5 MG tablet Take 0.5 mg by mouth 2 (two) times daily as needed for anxiety (agitation). ?Patient not taking: Reported on 07/09/2021    [provider]  ?Multiple Vitamins-Minerals (MULTIVITAMIN WITH MINERALS) tablet Take 1 tablet by mouth daily. Decubi Vite 452mcg-50mg -500mg     [provider]  ?POVIDONE-IODINE EX Apply 1 application topically See admin instructions. Paint stage 3 PU to left 5th metatarsal head and  right 5th metatarsal head with betadine and cover with foam  dressing qd.(Home health) ?Patient not taking: Reported on 07/09/2021    [provider]  ?   ? ?Allergies    ?Nitrofurantoin   ? ?Review of Systems   ?Review of Systems  ?Unable to perform ROS: Dementia  ? ?Physical Exam ?Updated Vital Signs ?BP 107/83   Pulse 81   Temp 99 ?F (37.2 ?C)   Resp 16   Ht 5\' 11"  (1.803 m)   Wt 69.9 kg   SpO2 90%   BMI 21.48 kg/m?  ?Physical Exam ?Vitals and nursing note reviewed.   ?Constitutional:   ?   General: She is sleeping. She is not in acute distress. ?   Appearance: She is not toxic-appearing.  ?HENT:  ?   Head: Normocephalic and atraumatic.  ?   Comments: No step-offs or hematomas ?   Right Ear: Tympanic membrane normal.  ?   Left Ear: Tympanic membrane normal.  ?   Nose: Nose normal.  ?   Mouth/Throat:  ?   Mouth: Mucous membranes are moist.  ?   Pharynx: Oropharynx is clear. Uvula midline. No oropharyngeal exudate or posterior oropharyngeal erythema.  ?Eyes:  ?   General: Lids are normal. Vision grossly intact.     ?   Right eye: No discharge.     ?   Left eye: No discharge.  ?   Extraocular Movements: Extraocular movements intact.  ?   Conjunctiva/sclera: Conjunctivae normal.  ?   Pupils: Pupils are equal, round, and reactive to light.  ?Neck:  ?   Vascular: No carotid bruit.  ?   Trachea: Trachea and phonation normal.  ?Cardiovascular:  ?   Rate and Rhythm: Normal rate and regular rhythm.  ?   Pulses: Normal pulses.  ?Pulmonary:  ?   Effort: Pulmonary effort is normal. No tachypnea, bradypnea, accessory muscle usage, prolonged expiration or respiratory distress.  ?   Breath sounds: Normal breath sounds. No wheezing or rales.  ?Chest:  ?   Chest wall: No mass, lacerations, deformity, swelling, tenderness, crepitus or edema.  ?Abdominal:  ?   General: Bowel sounds are normal. There is no distension.  ?   Palpations: Abdomen is soft.  ?   Tenderness: There is no abdominal tenderness. There is no right CVA tenderness, left CVA tenderness, guarding or rebound.  ?Musculoskeletal:     ?   General: No deformity.  ?   Cervical back: Normal range of motion and neck supple. No tenderness.  ?   Right lower leg: 1+ Edema present.  ?   Left lower leg: 1+ Edema present.  ?     Feet: ? ?   Comments: Patient seems quite tender over the left shoulder, left hip, has some chronic appearing deformity of the right wrist though she does not seem tender in this area. ?Additionally patient has  edematous, erythematous, warm distal right leg concerning for evolving cellulitis, appears tender to palpation. ?Pressure pads on bilateral heels.  ?Lymphadenopathy:  ?   Cervical: No cervical adenopathy.  ?Skin: ?   General: Skin is warm and dry.  ?   Capillary Refill: Capillary refill takes less than 2 seconds.  ?   Findings: Wound present.  ?Neurological:  ?   General: No focal deficit present.  ?   Mental Status: She is easily aroused. Mental status is at baseline.  ?   GCS: GCS eye subscore is 3. GCS verbal subscore is 4. GCS motor subscore is 6.  ?   Comments: Alert and  oriented only to self which is her baseline.  ?Psychiatric:     ?   Mood and Affect: Mood normal.  ? ? ?ED Results / Procedures / Treatments   ?Labs ?(all labs ordered are listed, but only abnormal results are displayed) ?Labs Reviewed  ?COMPREHENSIVE METABOLIC PANEL  ?CBC WITH DIFFERENTIAL/PLATELET  ?URINALYSIS, ROUTINE W REFLEX MICROSCOPIC  ? ? ?EKG ?None ? ?Radiology ?DG Tibia/Fibula Right ? ?Result Date: 07/08/2021 ?CLINICAL DATA:  Pain EXAM: RIGHT TIBIA AND FIBULA - 2 VIEW COMPARISON:  None. FINDINGS: Moderate tricompartmental degenerative changes of the knee. Tibiotalar joint is preserved. No fracture or dislocation is seen. Visualized soft tissues are within normal limits. No suprapatellar knee joint effusion. IMPRESSION: No fracture or dislocation is seen. Moderate degenerative changes of the knee. Electronically Signed   By: Julian Hy M.D.   On: 07/08/2021 23:10  ? ?DG Chest Portable 1 View ? ?Result Date: 07/08/2021 ?CLINICAL DATA:  Pain. EXAM: PORTABLE CHEST 1 VIEW COMPARISON:  Chest radiograph 04/13/2021 FINDINGS: Chronic and unchanged cardiomegaly. Hazy bilateral lung opacities consistent with pleural effusions. The patient's hands project over the left lower chest. There is vascular congestion. Increase in Peri scratch sec chronic peribronchial thickening. No pneumothorax. No acute osseous findings. IMPRESSION: 1. Bilateral  hazy opacities typical of pleural effusions. 2. Chronic cardiomegaly.  Vascular congestion. Electronically Signed   By: Keith Rake M.D.   On: 07/08/2021 23:16  ? ?DG Shoulder Left ? ?Result Date: 07/08/2021 ?CLINICAL

## 2021-07-08 NOTE — ED Triage Notes (Signed)
Pt BIB [facility] from [facility] complaining of L shoulder and tailbone pain. Pt and staff at facility deny injury/fall. H/x Alzheimer's.  ?120/90 ?HR 94 ? ?

## 2021-07-08 NOTE — ED Provider Notes (Signed)
23:45: Assumed care of patient at shift change pending remaining work-up and disposition. ? ?Please see prior provider note for full H&P.  Briefly patient is a 88 year old female with a history of dementia coming from [facility] skilled nursing facility for evaluation of pain in multiple locations without known injury.  Found to have right lower extremity cellulitis on exam. ? ?Physical Exam  ?BP 107/83   Pulse 81   Temp 99 ?F (37.2 ?C)   Resp 16   Ht 5\' 11"  (1.803 m)   Wt 69.9 kg   SpO2 90%   BMI 21.48 kg/m?  ? ?Physical Exam ? ? ? ? ? ?Procedures  ?Procedures ? ?ED Course / MDM  ?  ?Medical Decision Making ?Amount and/or Complexity of Data Reviewed ?Labs: ordered. ?Radiology: ordered. ? ?Risk ?Prescription drug management. ?Decision regarding hospitalization. ? ? ?Overall imaging reviewed does not appear to be significant for acute injury/process.  Labs are fairly reassuring. ? ?On reassessment of the patient she is tachycardic, EKG obtained she appears to be in A-fib.  Rectal temp 100.8.  Given patient is febrile and tachycardic with a right lower extremity cellulitis will obtain blood cultures, lactic acid, and start IV antibiotics with plan for admission.  Given she initially presented with pain in multiple location we will also check a CK. ? ?04:15: CONSULT: Discussed with hospitalist Dr. - accepts admission.  ? ?Discussed w/ attending Dr. Toniann Fail- in agreement.  ? ? ? ? ?  ?Pilar Plate, Cherly Anderson ?07/09/21 07/11/21 ? ?  ?[id number], MD ?07/09/21 [phone] ? ?

## 2021-07-09 ENCOUNTER — Inpatient Hospital Stay (HOSPITAL_COMMUNITY): Payer: Medicare PPO

## 2021-07-09 ENCOUNTER — Encounter (HOSPITAL_COMMUNITY): Payer: Self-pay | Admitting: Internal Medicine

## 2021-07-09 ENCOUNTER — Emergency Department (HOSPITAL_COMMUNITY): Payer: Medicare PPO

## 2021-07-09 DIAGNOSIS — G301 Alzheimer's disease with late onset: Secondary | ICD-10-CM | POA: Diagnosis not present

## 2021-07-09 DIAGNOSIS — N390 Urinary tract infection, site not specified: Secondary | ICD-10-CM | POA: Diagnosis present

## 2021-07-09 DIAGNOSIS — I10 Essential (primary) hypertension: Secondary | ICD-10-CM | POA: Diagnosis present

## 2021-07-09 DIAGNOSIS — Z8249 Family history of ischemic heart disease and other diseases of the circulatory system: Secondary | ICD-10-CM | POA: Diagnosis not present

## 2021-07-09 DIAGNOSIS — Z20822 Contact with and (suspected) exposure to covid-19: Secondary | ICD-10-CM | POA: Diagnosis present

## 2021-07-09 DIAGNOSIS — Z7189 Other specified counseling: Secondary | ICD-10-CM | POA: Diagnosis not present

## 2021-07-09 DIAGNOSIS — A4151 Sepsis due to Escherichia coli [E. coli]: Secondary | ICD-10-CM | POA: Diagnosis present

## 2021-07-09 DIAGNOSIS — A419 Sepsis, unspecified organism: Secondary | ICD-10-CM

## 2021-07-09 DIAGNOSIS — Z79899 Other long term (current) drug therapy: Secondary | ICD-10-CM | POA: Diagnosis not present

## 2021-07-09 DIAGNOSIS — Z90710 Acquired absence of both cervix and uterus: Secondary | ICD-10-CM | POA: Diagnosis not present

## 2021-07-09 DIAGNOSIS — Z66 Do not resuscitate: Secondary | ICD-10-CM | POA: Diagnosis present

## 2021-07-09 DIAGNOSIS — S72101A Unspecified trochanteric fracture of right femur, initial encounter for closed fracture: Secondary | ICD-10-CM | POA: Insufficient documentation

## 2021-07-09 DIAGNOSIS — Z515 Encounter for palliative care: Secondary | ICD-10-CM

## 2021-07-09 DIAGNOSIS — I480 Paroxysmal atrial fibrillation: Secondary | ICD-10-CM

## 2021-07-09 DIAGNOSIS — L0390 Cellulitis, unspecified: Secondary | ICD-10-CM | POA: Diagnosis present

## 2021-07-09 DIAGNOSIS — G309 Alzheimer's disease, unspecified: Secondary | ICD-10-CM | POA: Diagnosis present

## 2021-07-09 DIAGNOSIS — F02811 Dementia in other diseases classified elsewhere, unspecified severity, with agitation: Secondary | ICD-10-CM | POA: Diagnosis present

## 2021-07-09 DIAGNOSIS — Z79890 Hormone replacement therapy (postmenopausal): Secondary | ICD-10-CM | POA: Diagnosis not present

## 2021-07-09 DIAGNOSIS — D649 Anemia, unspecified: Secondary | ICD-10-CM | POA: Diagnosis not present

## 2021-07-09 DIAGNOSIS — L8990 Pressure ulcer of unspecified site, unspecified stage: Secondary | ICD-10-CM | POA: Diagnosis present

## 2021-07-09 DIAGNOSIS — E876 Hypokalemia: Secondary | ICD-10-CM | POA: Diagnosis present

## 2021-07-09 DIAGNOSIS — L89151 Pressure ulcer of sacral region, stage 1: Secondary | ICD-10-CM | POA: Diagnosis present

## 2021-07-09 DIAGNOSIS — F0280 Dementia in other diseases classified elsewhere without behavioral disturbance: Secondary | ICD-10-CM

## 2021-07-09 DIAGNOSIS — Z1624 Resistance to multiple antibiotics: Secondary | ICD-10-CM | POA: Diagnosis present

## 2021-07-09 DIAGNOSIS — L03115 Cellulitis of right lower limb: Secondary | ICD-10-CM | POA: Diagnosis present

## 2021-07-09 DIAGNOSIS — Z809 Family history of malignant neoplasm, unspecified: Secondary | ICD-10-CM | POA: Diagnosis not present

## 2021-07-09 DIAGNOSIS — Z8673 Personal history of transient ischemic attack (TIA), and cerebral infarction without residual deficits: Secondary | ICD-10-CM | POA: Diagnosis not present

## 2021-07-09 DIAGNOSIS — L89619 Pressure ulcer of right heel, unspecified stage: Secondary | ICD-10-CM

## 2021-07-09 DIAGNOSIS — R102 Pelvic and perineal pain: Secondary | ICD-10-CM

## 2021-07-09 DIAGNOSIS — L89613 Pressure ulcer of right heel, stage 3: Secondary | ICD-10-CM | POA: Diagnosis present

## 2021-07-09 DIAGNOSIS — E039 Hypothyroidism, unspecified: Secondary | ICD-10-CM | POA: Diagnosis present

## 2021-07-09 DIAGNOSIS — D6489 Other specified anemias: Secondary | ICD-10-CM | POA: Diagnosis present

## 2021-07-09 DIAGNOSIS — Z823 Family history of stroke: Secondary | ICD-10-CM | POA: Diagnosis not present

## 2021-07-09 DIAGNOSIS — I4891 Unspecified atrial fibrillation: Secondary | ICD-10-CM | POA: Diagnosis not present

## 2021-07-09 DIAGNOSIS — Z7982 Long term (current) use of aspirin: Secondary | ICD-10-CM | POA: Diagnosis not present

## 2021-07-09 LAB — CBC WITH DIFFERENTIAL/PLATELET
Abs Immature Granulocytes: 0.03 10*3/uL (ref 0.00–0.07)
Basophils Absolute: 0 10*3/uL (ref 0.0–0.1)
Basophils Relative: 0 %
Eosinophils Absolute: 0 10*3/uL (ref 0.0–0.5)
Eosinophils Relative: 0 %
HCT: 32.8 % — ABNORMAL LOW (ref 36.0–46.0)
Hemoglobin: 10.2 g/dL — ABNORMAL LOW (ref 12.0–15.0)
Immature Granulocytes: 0 %
Lymphocytes Relative: 21 %
Lymphs Abs: 2.1 10*3/uL (ref 0.7–4.0)
MCH: 27.7 pg (ref 26.0–34.0)
MCHC: 31.1 g/dL (ref 30.0–36.0)
MCV: 89.1 fL (ref 80.0–100.0)
Monocytes Absolute: 1.4 10*3/uL — ABNORMAL HIGH (ref 0.1–1.0)
Monocytes Relative: 13 %
Neutro Abs: 6.6 10*3/uL (ref 1.7–7.7)
Neutrophils Relative %: 66 %
Platelets: 285 10*3/uL (ref 150–400)
RBC: 3.68 MIL/uL — ABNORMAL LOW (ref 3.87–5.11)
RDW: 17.3 % — ABNORMAL HIGH (ref 11.5–15.5)
WBC: 10.1 10*3/uL (ref 4.0–10.5)
nRBC: 0 % (ref 0.0–0.2)

## 2021-07-09 LAB — URINALYSIS, MICROSCOPIC (REFLEX)

## 2021-07-09 LAB — RESP PANEL BY RT-PCR (FLU A&B, COVID) ARPGX2
Influenza A by PCR: NEGATIVE
Influenza B by PCR: NEGATIVE
SARS Coronavirus 2 by RT PCR: NEGATIVE

## 2021-07-09 LAB — PROTIME-INR
INR: 1.5 — ABNORMAL HIGH (ref 0.8–1.2)
Prothrombin Time: 18.1 seconds — ABNORMAL HIGH (ref 11.4–15.2)

## 2021-07-09 LAB — COMPREHENSIVE METABOLIC PANEL
ALT: 13 U/L (ref 0–44)
AST: 29 U/L (ref 15–41)
Albumin: 3.2 g/dL — ABNORMAL LOW (ref 3.5–5.0)
Alkaline Phosphatase: 87 U/L (ref 38–126)
Anion gap: 10 (ref 5–15)
BUN: 17 mg/dL (ref 8–23)
CO2: 25 mmol/L (ref 22–32)
Calcium: 8.5 mg/dL — ABNORMAL LOW (ref 8.9–10.3)
Chloride: 96 mmol/L — ABNORMAL LOW (ref 98–111)
Creatinine, Ser: 0.62 mg/dL (ref 0.44–1.00)
GFR, Estimated: >60 mL/min (ref >60)
Glucose, Bld: 115 mg/dL — ABNORMAL HIGH (ref 70–99)
Potassium: 4 mmol/L (ref 3.5–5.1)
Sodium: 131 mmol/L — ABNORMAL LOW (ref 135–145)
Total Bilirubin: 1.2 mg/dL (ref 0.3–1.2)
Total Protein: 7.4 g/dL (ref 6.5–8.1)

## 2021-07-09 LAB — MRSA NEXT GEN BY PCR, NASAL: MRSA by PCR Next Gen: NOT DETECTED

## 2021-07-09 LAB — URINALYSIS, ROUTINE W REFLEX MICROSCOPIC
Glucose, UA: NEGATIVE mg/dL
Ketones, ur: NEGATIVE mg/dL
Leukocytes,Ua: NEGATIVE
Nitrite: POSITIVE — AB
Protein, ur: 100 mg/dL — AB
Specific Gravity, Urine: >1.03 — ABNORMAL HIGH (ref 1.005–1.030)
pH: 5 (ref 5.0–8.0)

## 2021-07-09 LAB — TROPONIN I (HIGH SENSITIVITY)
Troponin I (High Sensitivity): 27 ng/L — ABNORMAL HIGH (ref <18)
Troponin I (High Sensitivity): 27 ng/L — ABNORMAL HIGH (ref <18)

## 2021-07-09 LAB — LACTIC ACID, PLASMA
Lactic Acid, Venous: 2.3 mmol/L (ref 0.5–1.9)
Lactic Acid, Venous: 1.4 mmol/L (ref 0.5–1.9)

## 2021-07-09 LAB — TSH: TSH: 2.669 u[IU]/mL (ref 0.350–4.500)

## 2021-07-09 LAB — APTT: aPTT: 49 seconds — ABNORMAL HIGH (ref 24–36)

## 2021-07-09 LAB — HEPARIN LEVEL (UNFRACTIONATED): Heparin Unfractionated: <0.1 IU/mL — ABNORMAL LOW (ref 0.30–0.70)

## 2021-07-09 LAB — CK: Total CK: 57 U/L (ref 38–234)

## 2021-07-09 MED ORDER — ACETAMINOPHEN 650 MG RE SUPP: 650.0000 mg | Freq: Four times a day (QID) | RECTAL | Status: DC | PRN

## 2021-07-09 MED ORDER — ARGINAID PO PACK
PACK | Freq: Two times a day (BID) | ORAL | Status: DC
Start: 2021-07-09 — End: 2021-07-09

## 2021-07-09 MED ORDER — LEVOTHYROXINE SODIUM 50 MCG PO TABS
50.0000 ug | ORAL_TABLET | Freq: Every day | ORAL | Status: DC
  Administered 2021-07-09 – 2021-07-14 (×5): 50 ug via ORAL
  Filled 2021-07-09 (×6): qty 1

## 2021-07-09 MED ORDER — ACETAMINOPHEN 325 MG PO TABS: 650.0000 mg | ORAL_TABLET | Freq: Four times a day (QID) | ORAL | Status: DC | PRN

## 2021-07-09 MED ORDER — HEPARIN (PORCINE) 25000 UT/250ML-% IV SOLN
1400.0000 [IU]/h | INTRAVENOUS | Status: DC
  Administered 2021-07-09: 1050 [IU]/h via INTRAVENOUS
  Administered 2021-07-10: 1250 [IU]/h via INTRAVENOUS
  Filled 2021-07-09 (×3): qty 250

## 2021-07-09 MED ORDER — SODIUM CHLORIDE 0.9 % IV SOLN
2.0000 g | Freq: Once | INTRAVENOUS | Status: AC
  Administered 2021-07-09: 2 g via INTRAVENOUS
  Filled 2021-07-09: qty 2

## 2021-07-09 MED ORDER — ORAL CARE MOUTH RINSE
15.0000 mL | Freq: Two times a day (BID) | OROMUCOSAL | Status: DC
  Administered 2021-07-09 – 2021-07-14 (×8): 15 mL via OROMUCOSAL

## 2021-07-09 MED ORDER — QUETIAPINE FUMARATE 50 MG PO TABS
50.0000 mg | ORAL_TABLET | Freq: Every day | ORAL | Status: DC
  Administered 2021-07-09 – 2021-07-13 (×5): 50 mg via ORAL
  Filled 2021-07-09 (×5): qty 1

## 2021-07-09 MED ORDER — DONEPEZIL HCL 10 MG PO TABS
10.0000 mg | ORAL_TABLET | Freq: Every day | ORAL | Status: DC
  Administered 2021-07-09 – 2021-07-13 (×5): 10 mg via ORAL
  Filled 2021-07-09 (×5): qty 1

## 2021-07-09 MED ORDER — ENOXAPARIN SODIUM 40 MG/0.4ML IJ SOSY: 40.0000 mg | PREFILLED_SYRINGE | INTRAMUSCULAR | Status: DC

## 2021-07-09 MED ORDER — LACTATED RINGERS IV BOLUS
2000.0000 mL | Freq: Once | INTRAVENOUS | Status: AC
  Administered 2021-07-09: 2000 mL via INTRAVENOUS

## 2021-07-09 MED ORDER — ACETAMINOPHEN 325 MG PO TABS
650.0000 mg | ORAL_TABLET | Freq: Four times a day (QID) | ORAL | Status: DC | PRN
  Administered 2021-07-09 – 2021-07-12 (×3): 650 mg via ORAL
  Filled 2021-07-09 (×4): qty 2

## 2021-07-09 MED ORDER — VANCOMYCIN HCL 1500 MG/300ML IV SOLN
1500.0000 mg | Freq: Once | INTRAVENOUS | Status: AC
  Administered 2021-07-09: 1500 mg via INTRAVENOUS
  Filled 2021-07-09: qty 300

## 2021-07-09 MED ORDER — CYANOCOBALAMIN 500 MCG PO TABS
250.0000 ug | ORAL_TABLET | Freq: Every day | ORAL | Status: DC
  Administered 2021-07-09 – 2021-07-14 (×5): 250 ug via ORAL
  Filled 2021-07-09 (×6): qty 1

## 2021-07-09 MED ORDER — VANCOMYCIN HCL 1250 MG/250ML IV SOLN
1250.0000 mg | INTRAVENOUS | Status: DC
  Administered 2021-07-10 – 2021-07-11 (×2): 1250 mg via INTRAVENOUS
  Filled 2021-07-09 (×2): qty 250

## 2021-07-09 MED ORDER — HEPARIN BOLUS VIA INFUSION
3000.0000 [IU] | Freq: Once | INTRAVENOUS | Status: AC
  Administered 2021-07-09: 3000 [IU] via INTRAVENOUS
  Filled 2021-07-09: qty 3000

## 2021-07-09 MED ORDER — SODIUM CHLORIDE 0.9 % IV SOLN
2.0000 g | Freq: Two times a day (BID) | INTRAVENOUS | Status: DC
  Administered 2021-07-09 – 2021-07-10 (×2): 2 g via INTRAVENOUS
  Filled 2021-07-09 (×2): qty 2

## 2021-07-09 MED ORDER — LATANOPROST 0.005 % OP SOLN
1.0000 [drp] | Freq: Every day | OPHTHALMIC | Status: DC
  Administered 2021-07-09: 1 [drp] via OPHTHALMIC
  Filled 2021-07-09 (×2): qty 2.5

## 2021-07-09 MED ORDER — CHLORHEXIDINE GLUCONATE CLOTH 2 % EX PADS
6.0000 | MEDICATED_PAD | Freq: Every day | CUTANEOUS | Status: DC
  Administered 2021-07-09 – 2021-07-10 (×2): 6 via TOPICAL

## 2021-07-09 MED ORDER — HEPARIN BOLUS VIA INFUSION
2000.0000 [IU] | Freq: Once | INTRAVENOUS | Status: AC
  Administered 2021-07-09: 2000 [IU] via INTRAVENOUS
  Filled 2021-07-09: qty 2000

## 2021-07-09 MED ORDER — HYDROCERIN EX CREA
TOPICAL_CREAM | Freq: Two times a day (BID) | CUTANEOUS | Status: DC
  Administered 2021-07-09: 1 via TOPICAL
  Filled 2021-07-09 (×2): qty 113

## 2021-07-09 MED ORDER — LACTATED RINGERS IV SOLN: INTRAVENOUS | Status: AC

## 2021-07-09 MED ORDER — HYDROCERIN EX CREA: TOPICAL_CREAM | Freq: Three times a day (TID) | CUTANEOUS | Status: DC

## 2021-07-09 MED ORDER — LIP MEDEX EX OINT
TOPICAL_OINTMENT | CUTANEOUS | Status: DC | PRN
  Filled 2021-07-09: qty 7

## 2021-07-09 MED ORDER — COLLAGENASE 250 UNIT/GM EX OINT
TOPICAL_OINTMENT | Freq: Every day | CUTANEOUS | Status: DC
  Filled 2021-07-09 (×2): qty 30

## 2021-07-09 NOTE — Progress Notes (Signed)
ANTICOAGULATION CONSULT NOTE - Initial Consult ? ?Pharmacy Consult for Heparin ?Indication: atrial fibrillation ? ?Allergies  ?Allergen Reactions  ? Nitrofurantoin   ? ? ?Patient Measurements: ?Height: 5\' 11"  (180.3 cm) ?Weight: 69.9 kg (154 lb) ?IBW/kg (Calculated) : 70.8 ?Heparin Dosing Weight: total body weight ? ?Vital Signs: ?Temp: 100.8 ?F (38.2 ?C) (03/20 0355) ?Temp Source: Rectal (03/20 0355) ?BP: 99/54 (03/20 0400) ?Pulse Rate: 108 (03/20 0359) ? ?Labs: ?Recent Labs  ?  07/09/21 ?0045  ?HGB 10.2*  ?HCT 32.8*  ?PLT 285  ?CREATININE 0.62  ? ? ?Estimated Creatinine Clearance: 49.5 mL/min (by C-G formula based on SCr of 0.62 mg/dL). ? ? ?Medical History: ?Past Medical History:  ?Diagnosis Date  ? Anxiety   ? Dementia ([place])   ? Memory loss   ? Thyroid disease   ? ? ?Medications:  ?No oral anticoagulation PTA ? ?Assessment: ?88 yr female brought to ED with right lower leg cellulitis. Patient found to be tachycardic and EKG shows AFib ? ?Goal of Therapy:  ?Heparin level 0.3-0.7 units/ml ?Monitor platelets by anticoagulation protocol: Yes ?  ?Plan:  ?Obtain baseline aPTT and PT/INR ?Heparin 3000 unit IV bolus x 1 followed by heparin gtt @ 1050 units/hr ?Check heparin level 8 hr after heparin started ?Daily heparin level & CBC ? ?Kaytlin Burklow, Toribio Harbour, PharmD ?07/09/2021,5:25 AM ? ? ?

## 2021-07-09 NOTE — H&P (Signed)
History and Physical    Whitney Schmidt UJW:[id number] DOB: 1926/10/02 DOA: 07/08/2021  PCP: Margit Hanks, MD  Patient coming from: Skilled nursing facility.  Chief Complaint: Pain.  HPI: Whitney Schmidt is a 88 y.o. female with history of hypertension, hypothyroidism, dementia, hearing difficulty was brought to the ER after patient was complaining of pain mostly in the right hip left shoulder.  Per report patient did not have fall.  ED Course: In the ER patient was febrile with temperature 100.8 tachycardic and was in A-fib with RVR.  Pressure systolic initially was 128 and at the time of my exam was 96 was dropping.  CT head is unremarkable x-ray so far did not show any fracture and at the time of my exam patient is also complaining of left wrist pain for which x-ray is pending.  COVID test is also pending.  On exam patient does have warmth in the right lower extremity and an ulcer on the right foot heel area.  Patient has been placed on a fluid bolus empiric antibiotics admitted for possible delving sepsis source could be the right foot cellulitis.  Review of Systems: As per HPI, rest all negative.   Past Medical History:  Diagnosis Date   Anxiety    Dementia (HCC)    Memory loss    Thyroid disease     Past Surgical History:  Procedure Laterality Date   ABDOMINAL HYSTERECTOMY     ABDOMINAL HYSTERECTOMY     ANTERIOR AND POSTERIOR VAGINAL REPAIR     APPENDECTOMY     FOOT SURGERY     KNEE SURGERY     TONSILLECTOMY     URETHRAL SLING       reports that she has never smoked. She has never used smokeless tobacco. She reports that she does not drink alcohol and does not use drugs.  Allergies  Allergen Reactions   Nitrofurantoin     Family History  Problem Relation Age of Onset   Hypertension Mother    Cancer Mother    Stroke Father    Hypertension Father     Prior to Admission medications   Medication Sig Start Date End Date Taking? Authorizing Provider  acetaminophen  (TYLENOL) 325 MG tablet Take 650 mg by mouth every 6 (six) hours as needed for moderate pain or headache.   Yes [provider]  acetaminophen (TYLENOL) 500 MG tablet Take 500 mg by mouth 3 (three) times daily.   Yes [provider]  amLODipine (NORVASC) 2.5 MG tablet Take 1 tablet (2.5 mg total) by mouth daily. 03/23/18  Yes Margit Hanks, MD  aspirin 325 MG EC tablet Take 325 mg by mouth every 6 (six) hours as needed for pain.   Yes [provider]  aspirin 325 MG tablet Take 325 mg by mouth daily.   Yes [provider]  bimatoprost (LUMIGAN) 0.01 % SOLN 1 drop at bedtime.   Yes [provider]  donepezil (ARICEPT) 10 MG tablet Take 1 tablet (10 mg total) by mouth at bedtime. 11/17/18  Yes Hollice Espy, MD  levothyroxine (SYNTHROID) 50 MCG tablet Take 1 tablet (50 mcg total) by mouth daily before breakfast. Take 1 tablet (50 mcg total) by moth daily at 0600 11/17/18  Yes Hollice Espy, MD  Nutritional Supplements (ARGINAID PO) Take 4.5 g by mouth 2 (two) times daily. Mix 1 packet with fluids prior to giving   Yes [provider]  QUEtiapine (SEROQUEL) 50 MG tablet Take 1 tablet (  50 mg total) by mouth at bedtime. 03/23/18  Yes Margit Hanks, MD  vitamin B-12 (CYANOCOBALAMIN) 250 MCG tablet Take 250 mcg by mouth daily.   Yes [provider]  Amino Acids-Protein Hydrolys (FEEDING SUPPLEMENT, PRO-STAT SUGAR FREE 64,) LIQD Take 30 mLs by mouth daily. Patient not taking: Reported on 07/09/2021    [provider]  benzocaine-resorcinol (VAGISIL) 5-2 % vaginal cream Place 1 application vaginally daily as needed for itching. Patient not taking: Reported on 07/09/2021    [provider]  Hydrocortisone-Aloe Vera (HYDROCORTISONE-ALOE EX) Apply 1 application topically daily as needed (itching). Patient not taking: Reported on 07/09/2021    [provider]  loperamide (IMODIUM A-D) 2 MG tablet Take 2 mg by  mouth 4 (four) times daily as needed for diarrhea or loose stools. Patient not taking: Reported on 07/09/2021    [provider]  LORazepam (ATIVAN) 0.5 MG tablet Take 0.5 mg by mouth 2 (two) times daily as needed for anxiety (agitation). Patient not taking: Reported on 07/09/2021    [provider]  Multiple Vitamins-Minerals (MULTIVITAMIN WITH MINERALS) tablet Take 1 tablet by mouth daily. Decubi Vite 448mcg-50mg -500mg     [provider]  POVIDONE-IODINE EX Apply 1 application topically See admin instructions. Paint stage 3 PU to left 5th metatarsal head and  right 5th metatarsal head with betadine and cover with foam dressing qd.(Home health) Patient not taking: Reported on 07/09/2021    [provider]    Physical Exam: Constitutional: Moderately built and nourished. Vitals:   07/09/21 0023 07/09/21 0355 07/09/21 0359 07/09/21 0400  BP: 124/80  106/60 (!) 99/54  Pulse: 92  (!) 108   Resp: 16  17 18   Temp:  (!) 100.8 F (38.2 C)    TempSrc:  Rectal    SpO2: 98%  98%   Weight:      Height:       Eyes: Anicteric no pallor. ENMT: No discharge from the ears eyes nose and mouth. Neck: No mass felt.  No neck rigidity. Respiratory: No rhonchi or crepitations. Cardiovascular: S1-S2 heard. Abdomen: Soft nontender bowel sound present. Musculoskeletal: Right lower extremity is erythematous and warm to touch below the knee and has an ulcer on the foot. Skin: Ulceration on the right heel area and right lower extremity below the knee is erythematous. Neurologic: Alert awake oriented to name and place.  Moving all extremities. Psychiatric: Oriented to name and place.   Labs on Admission: I have personally reviewed following labs and imaging studies  CBC: Recent Labs  Lab 07/09/21 0045  WBC 10.1  NEUTROABS 6.6  HGB 10.2*  HCT 32.8*  MCV 89.1  PLT 285   Basic Metabolic Panel: Recent Labs  Lab 07/09/21 0045  NA 131*  K 4.0  CL 96*  CO2 25   GLUCOSE 115*  BUN 17  CREATININE 0.62  CALCIUM 8.5*   GFR: Estimated Creatinine Clearance: 49.5 mL/min (by C-G formula based on SCr of 0.62 mg/dL). Liver Function Tests: Recent Labs  Lab 07/09/21 0045  AST 29  ALT 13  ALKPHOS 87  BILITOT 1.2  PROT 7.4  ALBUMIN 3.2*   No results for input(s): LIPASE, AMYLASE in the last 168 hours. No results for input(s): AMMONIA in the last 168 hours. Coagulation Profile: No results for input(s): INR, PROTIME in the last 168 hours. Cardiac Enzymes: No results for input(s): CKTOTAL, CKMB, CKMBINDEX, TROPONINI in the last 168 hours. BNP (last 3 results) No results for input(s): PROBNP in the  last 8760 hours. HbA1C: No results for input(s): HGBA1C in the last 72 hours. CBG: No results for input(s): GLUCAP in the last 168 hours. Lipid Profile: No results for input(s): CHOL, HDL, LDLCALC, TRIG, CHOLHDL, LDLDIRECT in the last 72 hours. Thyroid Function Tests: No results for input(s): TSH, T4TOTAL, FREET4, T3FREE, THYROIDAB in the last 72 hours. Anemia Panel: No results for input(s): VITAMINB12, FOLATE, FERRITIN, TIBC, IRON, RETICCTPCT in the last 72 hours. Urine analysis:    Component Value Date/Time   COLORURINE YELLOW 04/13/2021 1237   APPEARANCEUR CLEAR 04/13/2021 1237   LABSPEC 1.020 04/13/2021 1237   PHURINE 8.5 (H) 04/13/2021 1237   GLUCOSEU NEGATIVE 04/13/2021 1237   HGBUR MODERATE (A) 04/13/2021 1237   BILIRUBINUR NEGATIVE 04/13/2021 1237   KETONESUR NEGATIVE 04/13/2021 1237   PROTEINUR 30 (A) 04/13/2021 1237   NITRITE POSITIVE (A) 04/13/2021 1237   LEUKOCYTESUR NEGATIVE 04/13/2021 1237   Sepsis Labs: @LABRCNTIP (procalcitonin:4,lacticidven:4) )No results found for this or any previous visit (from the past 240 hour(s)).   Radiological Exams on Admission: DG Wrist Complete Right  Result Date: 07/09/2021 CLINICAL DATA:  deformity EXAM: RIGHT WRIST - COMPLETE 3+ VIEW COMPARISON:  None. FINDINGS: Diffusely decreased bone  density. There is no evidence of fracture or dislocation. Severe first carpometacarpal degenerative changes. At least mild carpal degenerative changes. At least mild to moderate radiocarpal and radioulnar large degenerative changes. Soft tissues are unremarkable. IMPRESSION: No acute displaced fracture or dislocation. Electronically Signed   By: Tish Frederickson M.D.   On: 07/09/2021 00:47   DG Tibia/Fibula Right  Result Date: 07/08/2021 CLINICAL DATA:  Pain EXAM: RIGHT TIBIA AND FIBULA - 2 VIEW COMPARISON:  None. FINDINGS: Moderate tricompartmental degenerative changes of the knee. Tibiotalar joint is preserved. No fracture or dislocation is seen. Visualized soft tissues are within normal limits. No suprapatellar knee joint effusion. IMPRESSION: No fracture or dislocation is seen. Moderate degenerative changes of the knee. Electronically Signed   By: Charline Bills M.D.   On: 07/08/2021 23:10   CT HEAD WO CONTRAST ( )  Result Date: 07/09/2021 CLINICAL DATA:  Fall EXAM: CT HEAD WITHOUT CONTRAST TECHNIQUE: Contiguous axial images were obtained from the base of the skull through the vertex without intravenous contrast. RADIATION DOSE REDUCTION: This exam was performed according to the departmental dose-optimization program which includes automated exposure control, adjustment of the mA and/or kV according to patient size and/or use of iterative reconstruction technique. COMPARISON:  None. FINDINGS: Brain: No evidence of acute infarction, hemorrhage, hydrocephalus, extra-axial collection or mass lesion/mass effect. Old left occipital infarct. Subcortical white matter and periventricular small vessel ischemic changes. Vascular: Intracranial atherosclerosis. Skull: Normal. Negative for fracture or focal lesion. Sinuses/Orbits: The visualized paranasal sinuses are essentially clear. The mastoid air cells are unopacified. Other: None. IMPRESSION: No evidence of acute intracranial abnormality. Old left occipital  infarct. Small vessel ischemic changes. Electronically Signed   By: Charline Bills M.D.   On: 07/09/2021 03:06   DG Chest Portable 1 View  Result Date: 07/08/2021 CLINICAL DATA:  Pain. EXAM: PORTABLE CHEST 1 VIEW COMPARISON:  Chest radiograph 04/13/2021 FINDINGS: Chronic and unchanged cardiomegaly. Hazy bilateral lung opacities consistent with pleural effusions. The patient's hands project over the left lower chest. There is vascular congestion. Increase in Peri scratch sec chronic peribronchial thickening. No pneumothorax. No acute osseous findings. IMPRESSION: 1. Bilateral hazy opacities typical of pleural effusions. 2. Chronic cardiomegaly.  Vascular congestion. Electronically Signed   By: Narda Rutherford M.D.   On: 07/08/2021 23:16   DG  Shoulder Left  Result Date: 07/08/2021 CLINICAL DATA:  Pain EXAM: LEFT SHOULDER - 2+ VIEW COMPARISON:  None. FINDINGS: No fracture or dislocation is seen. Degenerative changes with mild deformity of the humeral head and heterotopic calcification. The visualized soft tissues are unremarkable. Visualized left lung is clear. IMPRESSION: No fracture or dislocation is seen. Degenerative changes of the left shoulder with heterotopic calcification. Electronically Signed   By: Charline Bills M.D.   On: 07/08/2021 23:09   DG Foot Complete Right  Result Date: 07/08/2021 CLINICAL DATA:  Pain EXAM: RIGHT FOOT COMPLETE - 3+ VIEW COMPARISON:  None. FINDINGS: Postsurgical changes involving the 1st metatarsal. 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 No fracture or dislocation is seen. Degenerative changes at the 1st IP and MTP joints. Degenerative changes of the 2nd PIP and MTP joints. Mild soft tissue swelling along the dorsal forefoot. IMPRESSION: No fracture or dislocation is seen. Postsurgical and degenerative changes, as above. Electronically Signed   By: Charline Bills M.D.   On: 07/08/2021 23:12   DG Hip Unilat W or Wo Pelvis 2-3 Views Left  Result Date:  07/08/2021 CLINICAL DATA:  Pain EXAM: DG HIP (WITH OR WITHOUT PELVIS) 2-3V LEFT COMPARISON:  None. FINDINGS: No fracture or dislocation is seen. Left hip joint is preserved. Visualized bony pelvis appears intact. IMPRESSION: Negative. Electronically Signed   By: Charline Bills M.D.   On: 07/08/2021 23:12    EKG: Independently reviewed.  A-fib with RVR.  Assessment/Plan Principal Problem:   Cellulitis Active Problems:   Dementia (HCC)   Hypothyroidism   Sepsis (HCC)    Sepsis likely source could be right lower extremity cellulitis with an ulcer for which patient is on empiric antibiotics follow cultures continue IV fluids follow lactic acid levels. A-fib with RVR appears to be new onset.  Likely precipitated by sepsis.  We will keep patient on heparin for now.  Check TSH cardiac markers 2D echo. Generalized pain mostly in the right hip area and left shoulder and left wrist.  I have ordered a CT scan of the pelvis right foot and x-ray of the left wrist. Hypothyroidism on Synthroid check TSH. Chronic anemia follow CBC. Hypertension presently holding antihypertensives due to low normal blood pressure. History of dementia on Aricept.  Takes Seroquel also. Chronic right heel ulcer we will get a CT scan to make sure there is no deep wounds.  Had an extensive discussion with patient's son Mr. Behrendt and at this time patient's son wants antibiotics and fluids but if patient does not improve and if patient further decompensates then to make comfort measures.   COVID test is pending.   Since patient appears septic will need inpatient status.   DVT prophylaxis: Heparin infusion. Code Status: DNR. Family Communication: Patient's son. Disposition Plan: Back to history when stable. Consults called: Wound team and palliative care. Admission status: Inpatient.   Eduard Clos MD [facility] Pager [phone].  If 7PM-7AM, please contact  night-coverage [email or web address] Password [email or web address]  07/09/2021, 5:18 AM

## 2021-07-09 NOTE — ED Notes (Signed)
Attempted to call report to ICU/SD w/o answer.  ?

## 2021-07-09 NOTE — Progress Notes (Signed)
? ?PROGRESS NOTE ? ? ? ?Whitney Schmidt  DIY:[id number] DOB: 1926/09/04 DOA: 07/08/2021 ?PCP: Hennie Duos, MD ? ? ?Brief Narrative: ?Whitney Schmidt is a 88 y.o. female with a history of dementia, hypothyroidism, hearing difficulties, hypertension. Patient presented secondary to right hip and left shoulder pain of unknown etiology. While admitted, she met sepsis criteria with concern for cellulitis. She was also found have developed atrial fibrillation with RVR. Empiric antibiotics initiated. Heparin IV initiated. ? ? ?Assessment and Plan: ?* Sepsis ([place]) ?Presumed secondary to possible cellulitis. Urinalysis on admission significant for many bacteria/nitrites, and hematuria with RBCs; no WBCs noted. ?-Attempt to obtain urine culture from initial urine sample ?-Continue Cefepime and Vancomycin ? ?Cellulitis ?Unsure of patient's erythema is related to cellulitis vs dermatitis. Patient with very dry skin. Sharp demarcation at sock line. ?-Continue antibiotics. See problem, Sepsis ?-Eucerin cream BID ? ?Hypertension ?Patient is on amlodipine as an outpatient. Blood pressure currently labile. Antihypertensives held on admission ?-Continue to watch blood pressure ?-Restart amlodipine if persistently elevated blood pressure ? ?Dementia ([place]) ?-Continue Seroquel ? ?Hypothyroidism ?-Continue Synthroid ? ?Pelvic pain ?No known injury. Non-displaced fracture could not be excluded on recent CT pelvis with recommendation for MRI. ?-MRI pelvis ? ?Paroxysmal atrial fibrillation (HCC) ?New onset. Likely precipitated by current infection. Rapid ventricular response on admission which has now resolved spontaneously. Started on heparin IV for stroke prophylaxis. Patient is on aspirin 325 mg for prior history of stroke. ?-Continue heparin IV, although likely will not continue anticoagulation on discharge ? ?UTI (urinary tract infection) ?Urinalysis suggests possible infection. Patient appears to have likely been on pyridium secondary to  orange urine on urinalysis. Poor historian. Started on antibiotics for possible cellulitis and sepsis criteria met on admission. ? ?Pressure injury of skin ?Right heel. WOC recommendations (3/20): ?Dressing procedure/placement/frequency:  ?1. Clean right heel wound with saline, pat dry  ?2. Apply 1/4" thick layer of Santyl, top with 2x2 gauze moist with saline, not soaked\  ?3. Top with dry dressing, secure with kerlix  ?4. Offload with Prevalon boots as much as possible. ?5. Silicone foam the left lateral foot.  ? ? ? ?DVT prophylaxis: Heparin IV ?Code Status:   Code Status: DNR ?Family Communication: Son on telephone but no response ?Disposition Plan: Discharge likely back to facility in 1-3 days ? ? ?Consultants:  ?Palliative care ? ?Procedures:  ?None ? ?Antimicrobials: ?Vancomycin ?Cefepime  ? ? ?Subjective: ?Patient without concern at this time. Difficult interview secondary to severe hearing deficiency. ? ?Objective: ?BP (!) 122/104   Pulse 94   Temp 98.9 ?F (37.2 ?C) (Axillary)   Resp 16   Ht '5\' 11"'  (1.803 m)   Wt 69.9 kg   SpO2 100%   BMI 21.48 kg/m?  ? ?Examination: ? ?General exam: Appears calm and comfortable ?Respiratory system: Clear to auscultation. Respiratory effort normal. ?Cardiovascular system: S1 & S2 heard, RRR. No murmurs, rubs, gallops or clicks. ?Gastrointestinal system: Abdomen is nondistended, soft and nontender. Normal bowel sounds heard. ?Central nervous system: Alert. Significant difficulty in hearing ?Musculoskeletal: No edema. No calf tenderness ?Skin: No cyanosis. Right LE erythema ?Psychiatry: Judgement and insight appear normal. Mood & affect appropriate.  ? ? ?Data Reviewed: I have personally reviewed following labs and imaging studies ? ?CBC ?Lab Results  ?Component Value Date  ? WBC 10.1 07/09/2021  ? RBC 3.68 (L) 07/09/2021  ? HGB 10.2 (L) 07/09/2021  ? HCT 32.8 (L) 07/09/2021  ? MCV 89.1 07/09/2021  ? MCH 27.7 07/09/2021  ? PLT 285  07/09/2021  ? MCHC 31.1 07/09/2021  ?  RDW 17.3 (H) 07/09/2021  ? LYMPHSABS 2.1 07/09/2021  ? MONOABS 1.4 (H) 07/09/2021  ? EOSABS 0.0 07/09/2021  ? BASOSABS 0.0 07/09/2021  ? ? ? ?Last metabolic panel ?Lab Results  ?Component Value Date  ? NA 131 (L) 07/09/2021  ? K 4.0 07/09/2021  ? CL 96 (L) 07/09/2021  ? CO2 25 07/09/2021  ? BUN 17 07/09/2021  ? CREATININE 0.62 07/09/2021  ? GLUCOSE 115 (H) 07/09/2021  ? GFRNONAA >60 07/09/2021  ? GFRAA >60 11/16/2018  ? CALCIUM 8.5 (L) 07/09/2021  ? PROT 7.4 07/09/2021  ? ALBUMIN 3.2 (L) 07/09/2021  ? BILITOT 1.2 07/09/2021  ? ALKPHOS 87 07/09/2021  ? AST 29 07/09/2021  ? ALT 13 07/09/2021  ? ANIONGAP 10 07/09/2021  ? ? ?GFR: ?Estimated Creatinine Clearance: 49.5 mL/min (by C-G formula based on SCr of 0.62 mg/dL). ? ?Recent Results (from the past 240 hour(s))  ?Resp Panel by RT-PCR (Flu A&B, Covid) Nasopharyngeal Swab     Status: None  ? Collection Time: 07/09/21  6:01 AM  ? Specimen: Nasopharyngeal Swab; Nasopharyngeal(NP) swabs in vial transport medium  ?Result Value Ref Range Status  ? SARS Coronavirus 2 by RT PCR NEGATIVE NEGATIVE Final  ?  Comment: (NOTE) ?SARS-CoV-2 target nucleic acids are NOT DETECTED. ? ?The SARS-CoV-2 RNA is generally detectable in upper respiratory ?specimens during the acute phase of infection. The lowest ?concentration of SARS-CoV-2 viral copies this assay can detect is ?138 copies/mL. A negative result does not preclude SARS-Cov-2 ?infection and should not be used as the sole basis for treatment or ?other patient management decisions. A negative result may occur with  ?improper specimen collection/handling, submission of specimen other ?than nasopharyngeal swab, presence of viral mutation(s) within the ?areas targeted by this assay, and inadequate number of viral ?copies(<138 copies/mL). A negative result must be combined with ?clinical observations, patient history, and epidemiological ?information. The expected result is Negative. ? ?Fact Sheet for Patients:   ?EntrepreneurPulse.com.au ? ?Fact Sheet for Healthcare Providers:  ?IncredibleEmployment.be ? ?This test is no t yet approved or cleared by the [place] FDA and  ?has been authorized for detection and/or diagnosis of SARS-CoV-2 by ?FDA under an Emergency Use Authorization (EUA). This EUA will remain  ?in effect (meaning this test can be used) for the duration of the ?COVID-19 declaration under Section 564(b)(1) of the Act, 21 ?U.S.C.section 360bbb-3(b)(1), unless the authorization is terminated  ?or revoked sooner.  ? ? ?  ? Influenza A by PCR NEGATIVE NEGATIVE Final  ? Influenza B by PCR NEGATIVE NEGATIVE Final  ?  Comment: (NOTE) ?The Xpert Xpress SARS-CoV-2/FLU/RSV plus assay is intended as an aid ?in the diagnosis of influenza from Nasopharyngeal swab specimens and ?should not be used as a sole basis for treatment. Nasal washings and ?aspirates are unacceptable for Xpert Xpress SARS-CoV-2/FLU/RSV ?testing. ? ?Fact Sheet for Patients: ?EntrepreneurPulse.com.au ? ?Fact Sheet for Healthcare Providers: ?IncredibleEmployment.be ? ?This test is not yet approved or cleared by the [place] FDA and ?has been authorized for detection and/or diagnosis of SARS-CoV-2 by ?FDA under an Emergency Use Authorization (EUA). This EUA will remain ?in effect (meaning this test can be used) for the duration of the ?COVID-19 declaration under Section 564(b)(1) of the Act, 21 U.S.C. ?section 360bbb-3(b)(1), unless the authorization is terminated or ?revoked. ? ?Performed at [facility], [address] ?[place], [place] [address] ?  ?MRSA Next Gen by PCR, Nasal  Status: None  ? Collection Time: 07/09/21  8:36 AM  ? Specimen: Nasal Mucosa; Nasal Swab  ?Result Value Ref Range Status  ? MRSA by PCR Next Gen NOT DETECTED NOT DETECTED Final  ?  Comment: (NOTE) ?The GeneXpert MRSA Assay (FDA approved for NASAL specimens only), ?is one  component of a comprehensive MRSA colonization surveillance ?program. It is not intended to diagnose MRSA infection nor to guide ?or monitor treatment for MRSA infections. ?Test performance is not FDA approved in patients less than 2 year

## 2021-07-09 NOTE — Progress Notes (Signed)
A consult was received from an ED physician for Vancomycin and Cefepime per pharmacy dosing.  The patient's profile has been reviewed for ht/wt/allergies/indication/available labs.   ? ?A one time order has been placed for Vancomycin 1500mg  IV and Cefepime 2gm IV.   ? ?Further antibiotics/pharmacy consults should be ordered by admitting physician if indicated.       ?                ?Thank you, ? , PharmD ?07/09/2021  4:10 AM ? ?

## 2021-07-09 NOTE — ED Notes (Signed)
Attempted multiple IV sticks unsuccessfully. Provider aware. ?

## 2021-07-09 NOTE — Assessment & Plan Note (Signed)
Continue Synthroid °

## 2021-07-09 NOTE — ED Notes (Signed)
Attempted to straight cath patient for urine sample. Urine returned in catheter, but inadequate volume for testing. Provider aware. ?

## 2021-07-09 NOTE — Progress Notes (Signed)
Pharmacy: Re- heparin ? ?Patient is a 88 y.o F currently on heparin drip for afib with RVR. ? ?- heparin level obtained at 6p is undetectable at <0.10 ?- per pt's RN, no issues with IV line and no bleeding noted ? ?Goal of Therapy:  ?Heparin level 0.3-0.7 units/ml ?Monitor platelets by anticoagulation protocol: Yes ? ?Plan: ?- heparin 2000 units IV x1 bolus, then increase rate to 1250 units/hr ?- check 8 hr heparin level ?- monitor for s/sx bleeding ? ?Dorna Leitz, PharmD, BCPS ?07/09/2021 7:25 PM ? ?

## 2021-07-09 NOTE — Consult Note (Addendum)
WOC Nurse wound consult note ?Consultation was completed by review of records, images and assistance from the bedside nurse/clinical staff.  ?Reason for Consult: right heel wound, of note patient has left lateral foot wound described in wound care notes from 2 /28 [facility]) ?Stage 3 Pressure Injury; right heel; 80% granulation/20% necrotic tissue ?Stage 2 Pressure Injury; left lateral foot ?Pressure Injury POA: Yes ?Measurement:requested for chart with new topical care orders  ?Wound bed:see above  ?Drainage (amount, consistency, odor) see nursing flow sheets ?Periwound: intact; redness noted right pretibial and calf, MD ordered MRI of the right foot to R/O osteomyelitis  ?Dressing procedure/placement/frequency:  ?1. Clean right heel wound with saline, pat dry  ?2. Apply 1/4" thick layer of Santyl, top with 2x2 gauze moist with saline, not soaked\  ?3. Top with dry dressing, secure with kerlix  ?4. Offload with Prevalon boots as much as possible. ?5. Silicone foam the left lateral foot.  ?Noted son would like antibiotics and fluids no other aggressive measure; DNR ? ?Discussed POC with patient and bedside nurse.  ?Re consult if needed, will not follow at this time. ?Thanks ? Leibish Mcgregor [facility] MSN, RN,CWOCN, CNS, CWON-AP [phone])  ?

## 2021-07-09 NOTE — ED Notes (Signed)
Attempted lab draw. Attempt unsuccessful. ?

## 2021-07-09 NOTE — Assessment & Plan Note (Addendum)
Unsure of patient's erythema is related to cellulitis vs dermatitis. Patient with very dry skin. Sharp demarcation at sock line on admission. Erythema now resolved. ?-Continue antibiotics. See problem, Sepsis ?-Eucerin cream BID ?

## 2021-07-09 NOTE — Consult Note (Signed)
? ?                                                                                ?Consultation Note ?Date: 07/09/2021  ? ?Patient Name: Whitney Schmidt  ?DOB: 1926/10/28  MRN: [id number]  Age / Sex: 88 y.o., female  ?PCP: Hennie Duos, MD ?Referring Physician: Rise Patience, MD ? ?Reason for Consultation: Establishing goals of care ? ?HPI/Patient Profile: 88 y.o. female  with past medical history of dementia, hypertension, hypothyroidism, anxiety, hearing loss admitted on 07/08/2021 from [place] with pain to right hip and left shoulder with no reported falls. Diagnosed with sepsis likely from RLE cellulitis and right heel ulcer with new atrial fibrillation RVR. Noted conversation with son who indicates desire for antibiotics and IV fluids but comfort if decline.  ? ?Clinical Assessment and Goals of Care: ?I met today with Whitney Schmidt but she is very lethargic. She awakens occasionally and will answer yes or no but the majority of the time she does not respond. She appears to be resting comfortably.  ? ?I called and spoke with son, Whitney Schmidt. Whitney Schmidt and I discuss his mother's underlying dementia and he reports that she has had gradual decline over the past 3 years with dementia but especially over the past months where she went from ALF to skilled care at [facility] where she lives now. She has worsening skin breakdown. Whitney Schmidt shares how he has missed his mother over the years and we reflect on the prolonged grieving process with dementia. Whitney Schmidt and I discuss overall poor prognosis due to dementia and difficult to know how well she will bounce back from this acute illness. We discussed that only time will tell how well she will be able to recover and thrive and how well she will eat/drink and be able to be alert. We discussed ongoing challenges and high risk of further complications and illnesses due to declining functional status. Whitney Schmidt understands and he wants to ensure that his mother is  comfortable and does not suffer. We agree to continue conservative care and can discuss comfort care if she declines further. We discussed the recommendation for hospice support into the future and will consider hospice at [facility] vs hospice facility depending on outcomes. Most likely could benefit from hospice at [facility]. Whitney Schmidt is open to recommendations and hospice support. Whitney Schmidt will speak with his sister and we will speak more tomorrow. I told Whitney Schmidt that his mother's body will let us know what she needs and we will help him figure out what will be best for her.  ? ?All questions/concerns addressed. Emotional support provided. ? ?Primary Decision Maker ?NEXT OF KIN son and daughter ?  ? ?SUMMARY OF RECOMMENDATIONS   ?- DNR previously eestablished ?- Continue conservative care ?- Ongoing conversations - will likely benefit from hospice services after hospital stay ? ?Code Status/Advance Care Planning: ?DNR ? ? ?Symptom Management:  ?Per attending.  ? ?Palliative Prophylaxis:  ?Aspiration, Bowel Regimen, Delirium Protocol, Frequent Pain Assessment, Oral Care, Palliative Wound Care, and Turn Reposition ? ? ?Prognosis:  ?< 6 months very likely ? ?Discharge Planning: To Be Determined  ? ?  ? ?  Primary Diagnoses: ?Present on Admission: ? Cellulitis ? Sepsis ([place]) ? Dementia ([place]) ? Hypothyroidism ? ? ?I have reviewed the medical record, interviewed the patient and family, and examined the patient. The following aspects are pertinent. ? ?Past Medical History:  ?Diagnosis Date  ? Anxiety   ? Dementia ([place])   ? Memory loss   ? Thyroid disease   ? ?Social History  ? ?Socioeconomic History  ? Marital status: Widowed  ?  Spouse name: Not on file  ? Number of children: Not on file  ? Years of education: Not on file  ? Highest education level: Not on file  ?Occupational History  ? Not on file  ?Tobacco Use  ? Smoking status: Never  ? Smokeless tobacco: Never  ?Substance and Sexual Activity  ? Alcohol use: No  ? Drug  use: No  ? Sexual activity: Not on file  ?Other Topics Concern  ? Not on file  ?Social History Narrative  ? Not on file  ? ?Social Determinants of Health  ? ?Financial Resource Strain: Not on file  ?Food Insecurity: Not on file  ?Transportation Needs: Not on file  ?Physical Activity: Not on file  ?Stress: Not on file  ?Social Connections: Not on file  ? ?Family History  ?Problem Relation Age of Onset  ? Hypertension Mother   ? Cancer Mother   ? Stroke Father   ? Hypertension Father   ? ?Scheduled Meds: ? Chlorhexidine Gluconate Cloth  6 each Topical Daily  ? collagenase   Topical Daily  ? donepezil  10 mg Oral QHS  ? latanoprost  1 drop Both Eyes QHS  ? levothyroxine  50 mcg Oral QAC breakfast  ? mouth rinse  15 mL Mouth Rinse BID  ? QUEtiapine  50 mg Oral QHS  ? vitamin B-12  250 mcg Oral Daily  ? ?Continuous Infusions: ? ceFEPime (MAXIPIME) IV    ? heparin 1,050 Units/hr (07/09/21 0857)  ? lactated ringers 100 mL/hr at 07/09/21 0856  ? [START ON 07/10/2021] vancomycin    ? ?PRN Meds:.acetaminophen **OR** acetaminophen, lip balm ?Allergies  ?Allergen Reactions  ? Nitrofurantoin   ? ?Review of Systems  ?Unable to perform ROS: Dementia  ? ?Physical Exam ?Vitals and nursing note reviewed.  ?Constitutional:   ?   General: She is sleeping. She is not in acute distress. ?   Appearance: She is ill-appearing.  ?Cardiovascular:  ?   Rate and Rhythm: Normal rate.  ?Pulmonary:  ?   Effort: No tachypnea, accessory muscle usage or respiratory distress.  ?Abdominal:  ?   General: Abdomen is flat.  ?   Palpations: Abdomen is soft.  ?Skin: ?   Comments: RLE erythema  ?Neurological:  ?   Mental Status: She is confused.  ? ? ?Vital Signs: BP (!) 96/55   Pulse 98   Temp 98.2 ?F (36.8 ?C) (Oral)   Resp 15   Ht _0  (1.803 m)   Wt 69.9 kg   SpO2 95%   BMI 21.48 kg/m?  ?Pain Scale: PAINAD ?  ?Pain Score: Asleep ? ? ?SpO2: SpO2: 95 % ?O2 Device:SpO2: 95 % ?O2 Flow Rate: .  ? ?IO: Intake/output summary:  ?Intake/Output Summary  (Last 24 hours) at 07/09/2021 1058 ?Last data filed at 07/09/2021 [phone] ?Gross per 24 hour  ?Intake 100 ml  ?Output --  ?Net 100 ml  ? ? ?LBM: Last BM Date :  (UNknown) ?Baseline Weight: Weight: 69.9 kg ?Most recent weight: Weight: 69.9 kg     ?  Palliative Assessment/Data: ? ? ? ? ?Time Total: 75 min ? ?Greater than 50%  of this time was spent counseling and coordinating care related to the above assessment and plan. ? ?Signed by: ?Vinie Sill, NP ?Palliative Medicine Team ?Pager # [phone] (M-F 8a-5p) ?Team Phone # [phone] (Nights/Weekends) ?  ? ? ? ? ? ? ? ? ? ? ? ? ?  ?

## 2021-07-09 NOTE — Assessment & Plan Note (Addendum)
New onset. Likely precipitated by current infection. Rapid ventricular response on admission which has now resolved spontaneously. Started on heparin IV for stroke prophylaxis. Patient is on aspirin 325 mg for prior history of stroke. ?-Continue heparin IV, although likely will not continue anticoagulation on discharge ? ?Addendum: difficulty obtaining heparin levels. Patient is a high fall risk with history of falls and dementia. Will recommend to stop anticoagulation at this point. Will resume patient's aspirin 325 mg daily. ?

## 2021-07-09 NOTE — Consult Note (Signed)
wound

## 2021-07-09 NOTE — Assessment & Plan Note (Addendum)
History of fall in December 2022, but no known recent fall. Patient with a history of comminuted displaced fracture of the greater trochanter in 12/22, treated non-operatively. Non-displaced fracture could not be excluded on admission CT pelvis with recommendation for MRI. MRI confirms similar fracture. ?-Orthopedic surgery consult ?

## 2021-07-09 NOTE — Assessment & Plan Note (Signed)
Right heel. WOC recommendations (3/20): ?? Dressing procedure/placement/frequency:  ?1. Clean right heel wound with saline, pat dry  ?2. Apply 1/4" thick layer of Santyl, top with 2x2 gauze moist with saline, not soaked\  ?3. Top with dry dressing, secure with kerlix  ?4. Offload with Prevalon boots as much as possible. ?5. Silicone foam the left lateral foot.  ?

## 2021-07-09 NOTE — Progress Notes (Signed)
PHARMACIST - PHYSICIAN ORDER COMMUNICATION ? ?CONCERNING: P&T Medication Policy on Herbal Medications ? ?DESCRIPTION:  This patient?s order for:  Arginaid  has been noted. ? ?This product(s) is classified as an ?herbal? or natural product. ?Due to a lack of definitive safety studies or FDA approval, nonstandard manufacturing practices, plus the potential risk of unknown drug-drug interactions while on inpatient medications, the Pharmacy and Therapeutics Committee does not permit the use of ?herbal? or natural products of this type within [facility]. ?  ?ACTION TAKEN: ?The pharmacy department is unable to verify this order at this time and your patient has been informed of this safety policy. ?Please reevaluate patient?s clinical condition at discharge and address if the herbal or natural product(s) should be resumed at that time.  ? ?Terrilee Files, PharmD ?

## 2021-07-09 NOTE — ED Notes (Signed)
Attempted to call report x2.  Asked to call back in 5-10mins.  ?

## 2021-07-09 NOTE — Assessment & Plan Note (Addendum)
Patient is on amlodipine as an outpatient. Blood pressure currently elevated. Antihypertensives held on admission ?-Restart home amlodipine ?

## 2021-07-09 NOTE — Hospital Course (Signed)
Whitney Schmidt is a 88 y.o. female with a history of dementia, hypothyroidism, hearing difficulties, hypertension. Patient presented secondary to right hip and left shoulder pain of unknown etiology. While admitted, she met sepsis criteria with concern for cellulitis. She was also found have developed atrial fibrillation with RVR. Empiric antibiotics initiated. Heparin IV initiated. ?

## 2021-07-09 NOTE — Assessment & Plan Note (Addendum)
Presumed secondary to possible cellulitis. Urinalysis on admission significant for many bacteria/nitrites, and hematuria with RBCs; no WBCs noted. Urine culture added on to prior urinalysis sample collection ?-Discontinue Cefepime and switch to Ceftriaxone. Continue Vancomycin ?-Follow-up blood and urine cultures ?

## 2021-07-09 NOTE — Assessment & Plan Note (Signed)
Continue Seroquel. 

## 2021-07-09 NOTE — Assessment & Plan Note (Signed)
Urinalysis suggests possible infection. Patient appears to have likely been on pyridium secondary to orange urine on urinalysis. Poor historian. Started on antibiotics for possible cellulitis and sepsis criteria met on admission. ?

## 2021-07-09 NOTE — Progress Notes (Signed)
Pharmacy Antibiotic Note ? ?Whitney Schmidt is a 88 y.o. female admitted on 07/08/2021 with sepsis due to right foot cellulitis.  Pharmacy has been consulted for Vancomycin and Cefepime dosing. ? ?Plan: ?Vancomycin 1500mg  IV x 1 loading dose followed by Vancomycin 1250 mg IV Q 24 hrs. Goal AUC 400-550.  Expected AUC: 545.9  SCr used: 0.8 (rounded up from 0.62) ?Cefepime 2gm IV q12h ?Follow renal function ?F/u culture results and sensitivities ? ?Height: 5\' 11"  (180.3 cm) ?Weight: 69.9 kg (154 lb) ?IBW/kg (Calculated) : 70.8 ? ?Temp (24hrs), Avg:99.9 ?F (37.7 ?C), Min:99 ?F (37.2 ?C), Max:100.8 ?F (38.2 ?C) ? ?Recent Labs  ?Lab 07/09/21 ?0045  ?WBC 10.1  ?CREATININE 0.62  ?  ?Estimated Creatinine Clearance: 49.5 mL/min (by C-G formula based on SCr of 0.62 mg/dL).   ? ?Allergies  ?Allergen Reactions  ? Nitrofurantoin   ? ? ?Antimicrobials this admission: ?3/20 Cefepime >>   ?3/20 Vancomycin >>   ? ?Dose adjustments this admission: ?  ? ?Microbiology results: ?3/20 BCx:   ?  ? ?Thank you for allowing pharmacy to be a part of this patient?s care. ? ?Everette Rank, PharmD ?07/09/2021 5:41 AM ? ?

## 2021-07-10 ENCOUNTER — Inpatient Hospital Stay (HOSPITAL_COMMUNITY): Payer: Medicare PPO

## 2021-07-10 DIAGNOSIS — Z515 Encounter for palliative care: Secondary | ICD-10-CM | POA: Diagnosis not present

## 2021-07-10 DIAGNOSIS — S72101A Unspecified trochanteric fracture of right femur, initial encounter for closed fracture: Secondary | ICD-10-CM

## 2021-07-10 DIAGNOSIS — G301 Alzheimer's disease with late onset: Secondary | ICD-10-CM | POA: Diagnosis not present

## 2021-07-10 DIAGNOSIS — D649 Anemia, unspecified: Secondary | ICD-10-CM

## 2021-07-10 DIAGNOSIS — A419 Sepsis, unspecified organism: Secondary | ICD-10-CM | POA: Diagnosis not present

## 2021-07-10 DIAGNOSIS — Z7189 Other specified counseling: Secondary | ICD-10-CM | POA: Diagnosis not present

## 2021-07-10 DIAGNOSIS — L03115 Cellulitis of right lower limb: Secondary | ICD-10-CM | POA: Diagnosis not present

## 2021-07-10 DIAGNOSIS — I10 Essential (primary) hypertension: Secondary | ICD-10-CM

## 2021-07-10 LAB — CBC
HCT: 29.7 % — ABNORMAL LOW (ref 36.0–46.0)
Hemoglobin: 9.1 g/dL — ABNORMAL LOW (ref 12.0–15.0)
MCH: 27.2 pg (ref 26.0–34.0)
MCHC: 30.6 g/dL (ref 30.0–36.0)
MCV: 88.7 fL (ref 80.0–100.0)
Platelets: 261 10*3/uL (ref 150–400)
RBC: 3.35 MIL/uL — ABNORMAL LOW (ref 3.87–5.11)
RDW: 17.3 % — ABNORMAL HIGH (ref 11.5–15.5)
WBC: 9.7 10*3/uL (ref 4.0–10.5)
nRBC: 0 % (ref 0.0–0.2)

## 2021-07-10 LAB — HEPARIN LEVEL (UNFRACTIONATED): Heparin Unfractionated: 0.22 IU/mL — ABNORMAL LOW (ref 0.30–0.70)

## 2021-07-10 MED ORDER — ASPIRIN 325 MG PO TABS
325.0000 mg | ORAL_TABLET | Freq: Every day | ORAL | Status: DC
  Administered 2021-07-10 – 2021-07-14 (×4): 325 mg via ORAL
  Filled 2021-07-10 (×6): qty 1

## 2021-07-10 MED ORDER — AMLODIPINE BESYLATE 5 MG PO TABS
2.5000 mg | ORAL_TABLET | Freq: Every day | ORAL | Status: DC
  Administered 2021-07-10: 2.5 mg via ORAL
  Filled 2021-07-10: qty 1

## 2021-07-10 MED ORDER — SODIUM CHLORIDE 0.9 % IV SOLN
2.0000 g | INTRAVENOUS | Status: DC
  Administered 2021-07-10: 2 g via INTRAVENOUS
  Filled 2021-07-10 (×2): qty 20

## 2021-07-10 NOTE — Assessment & Plan Note (Signed)
Unsure of etiology. Baseline is around 10-11. Hemoglobin down to 9.1 today. Unknown etiology. Complicated by heparin use while inpatient. ?-Iron panel ?-CBC in AM ?

## 2021-07-10 NOTE — Progress Notes (Signed)
[place] Dementia, Sepsis, 89yoF, Patient is on tele, but keeps removing tele leads, Mitts on but removes it, Can we but her on standby tele ?

## 2021-07-10 NOTE — Progress Notes (Signed)
ANTICOAGULATION CONSULT NOTE - follow up ? ?Pharmacy Consult for Heparin ?Indication: atrial fibrillation ? ?Allergies  ?Allergen Reactions  ? Nitrofurantoin   ? ? ?Patient Measurements: ?Height: 5\' 11"  (180.3 cm) ?Weight: 69.9 kg (154 lb) ?IBW/kg (Calculated) : 70.8 ?Heparin Dosing Weight: total body weight ? ?Vital Signs: ?Temp: 99.2 ?F (37.3 ?C) (03/20 2300) ?Temp Source: Oral (03/20 2300) ?BP: 150/133 (03/21 0500) ?Pulse Rate: 100 (03/21 0500) ? ?Labs: ?Recent Labs  ?  07/09/21 ?0045 07/09/21 ?0829 07/09/21 ?1146 07/09/21 ?1809 07/10/21 ?[id number]  ?HGB 10.2*  --   --   --  9.1*  ?HCT 32.8*  --   --   --  29.7*  ?PLT 285  --   --   --  261  ?APTT  --  49*  --   --   --   ?LABPROT  --  18.1*  --   --   --   ?INR  --  1.5*  --   --   --   ?HEPARINUNFRC  --   --   --  <0.10* 0.22*  ?CREATININE 0.62  --   --   --   --   ?CKTOTAL 57  --   --   --   --   ?TROPONINIHS  --  27* 27*  --   --   ? ? ? ?Estimated Creatinine Clearance: 49.5 mL/min (by C-G formula based on SCr of 0.62 mg/dL). ? ? ?Medical History: ?Past Medical History:  ?Diagnosis Date  ? Anxiety   ? Dementia ([place])   ? Memory loss   ? Thyroid disease   ? ? ?Medications:  ?No oral anticoagulation PTA ? ?Assessment: ?88 yr female brought to ED with right lower leg cellulitis. Patient found to be tachycardic and EKG shows AFib ? ?07/10/2021 ?HL 0.22 subtherapeutic on 1250 units/hr ?Hgb 9.1, plts WNL ?No bleeding noted ? ? ?Goal of Therapy:  ?Heparin level 0.3-0.7 units/ml ?Monitor platelets by anticoagulation protocol: Yes ?  ?Plan:  ?Increase heparin drip to 1400 units/hr ?Check heparin level 8 hr  ?Daily heparin level & CBC ? ?Dolly Rias RPh ?07/10/2021, 6:57 AM ? ? ?

## 2021-07-10 NOTE — Progress Notes (Signed)
Attempted to do morning assessment on pt with student nurse. Asked pt to open eyes & pt refused. Pt stated "Dont touch me that's rude." Told pt I needed to listen to her lungs & heart. Told her I was pulling the blanket back, Pt began swinging her arm at me. Pt smacked my hand twice. Told pt okay & stepped back. Went to pt's feet & let her know we need to look at them. While feeling pulses, Pt kicked her foot towards my hand, hitting me again. Pt stable at this time. Pt breathing symmetrical & unlabored.  ?

## 2021-07-10 NOTE — TOC Initial Note (Signed)
Transition of Care (TOC) - Initial/Assessment Note  ? ?Patient Details  ?Name: Whitney Schmidt ?MRN: [id number] ?Date of Birth: [birth year] ? ?Transition of Care (TOC) CM/SW Contact:    ?Sherie Don, LCSW ?Phone Number: ?07/10/2021, 3:22 PM ? ?Clinical Narrative: TOC consulted as patient is a resident of [facility]. CSW spoke with patient's son, Kyrstan Marta, to complete brief assessment. Per son, patient recently transferred from memory care at [facility] to [facility] a little over a month ago. The plan is for the patient to return to [facility], but palliative is discussing the possibility of the patient returning with hospice services with family. TOC to follow. ? ?Expected Discharge Plan: [place] ?Barriers to Discharge: Continued Medical Work up ? ?Patient Goals and CMS Choice ?Patient states their goals for this hospitalization and ongoing recovery are:: Return to [facility] ?CMS Medicare.gov Compare Post Acute Care list provided to:: Patient Represenative (must comment) ?Choice offered to / list presented to : Adult Children ? ?Expected Discharge Plan and Services ?Expected Discharge Plan: [place] ?In-house Referral: Clinical Social Work ?Post Acute Care Choice: [place] ?Living arrangements for the past 2 months: [place]           ?DME Arranged: N/A ?DME Agency: NA ? ?Prior Living Arrangements/Services ?Living arrangements for the past 2 months: [place] ?Lives with:: Facility Resident ?Patient language and need for interpreter reviewed:: Yes ?Do you feel safe going back to the place where you live?: Yes      ?Need for Family Participation in Patient Care: Yes (Comment) (Patient has dementia.) ?Care giver support system in place?: Yes (comment) ?Criminal Activity/Legal Involvement Pertinent to Current Situation/Hospitalization: No - Comment as needed ? ?Activities of Daily Living ?Home Assistive  Devices/Equipment: Gilford Rile (specify type) ?ADL Screening (condition at time of admission) ?Patient's cognitive ability adequate to safely complete daily activities?: No ?Is the patient deaf or have difficulty hearing?: Yes ?Does the patient have difficulty seeing, even when wearing glasses/contacts?: No ?Does the patient have difficulty concentrating, remembering, or making decisions?: Yes ?Patient able to express need for assistance with ADLs?: Yes ?Does the patient have difficulty dressing or bathing?: Yes ?Independently performs ADLs?: No ?Communication: Independent ?Dressing (OT): Needs assistance ?Is this a change from baseline?: Pre-admission baseline ?Grooming: Needs assistance ?Is this a change from baseline?: Pre-admission baseline ?Feeding: Needs assistance ?Is this a change from baseline?: Pre-admission baseline ?Bathing: Dependent ?Is this a change from baseline?: Pre-admission baseline ?Toileting: Dependent ?Is this a change from baseline?: Pre-admission baseline ?In/Out Bed: Dependent ?Is this a change from baseline?: Pre-admission baseline ?Walks in Home: Needs assistance ?Is this a change from baseline?: Pre-admission baseline ?Does the patient have difficulty walking or climbing stairs?: Yes ?Weakness of Legs: Both ?Weakness of Arms/Hands: Both ? ?Permission Sought/Granted ?Permission sought to share information with : Customer service manager ?Permission granted to share information with : Yes, Verbal Permission Granted ?Permission granted to share info w AGENCY: [facility] ? ?Emotional Assessment ?Orientation: : Oriented to Self ?Alcohol / Substance Use: Not Applicable ?Psych Involvement: No (comment) ? ?Admission diagnosis:  Cellulitis [L03.90] ?Sepsis ([place]) [A41.9] ?Patient Active Problem List  ? Diagnosis Date Noted  ? Chronic anemia 07/10/2021  ? Cellulitis 07/09/2021  ? Sepsis ([place]) 07/09/2021  ? Pressure injury of skin 07/09/2021  ? UTI (urinary tract infection) 07/09/2021  ?  Paroxysmal atrial fibrillation ([place]) 07/09/2021  ? Trochanteric fracture of right femur ([place]) 07/09/2021  ? COVID-19 virus infection 11/16/2018  ?  Dehydration 11/16/2018  ? Weakness   ? Rhabdomyolysis 03/06/2018  ? Dementia ([place]) 03/06/2018  ? Hypertension 03/06/2018  ? Hypothyroidism 03/06/2018  ? History of cardioembolic cerebrovascular accident (CVA) 03/06/2018  ? Pressure injury of toe, unstageable ([place]) 03/06/2018  ? Pressure injury of toe, stage 3 ([place]) 03/06/2018  ? ?PCP:  Hennie Duos, MD ?Pharmacy:   ?[facility] [id number] - [place], [place] ?[place] ?[place] [address] ?Phone: [phone] Fax: [phone] ? ?[place] [address] ?[place] [address] ?[address] ?[place] [address] ?Phone: [phone] Fax: [phone] ? ?Readmission Risk Interventions ?No flowsheet data found. ? ?

## 2021-07-10 NOTE — Progress Notes (Addendum)
? ?PROGRESS NOTE ? ? ? ?Whitney Schmidt  HEN:[id number] DOB: [birth year] DOA: 07/08/2021 ?PCP: Hennie Duos, MD ? ? ?Brief Narrative: ?Whitney Schmidt is a 88 y.o. female with a history of dementia, hypothyroidism, hearing difficulties, hypertension. Patient presented secondary to right hip and left shoulder pain of unknown etiology. While admitted, she met sepsis criteria with concern for cellulitis. She was also found have developed atrial fibrillation with RVR. Empiric antibiotics initiated. Heparin IV initiated. ? ? ?Assessment and Plan: ?* Sepsis ([place]) ?Presumed secondary to possible cellulitis. Urinalysis on admission significant for many bacteria/nitrites, and hematuria with RBCs; no WBCs noted. Urine culture added on to prior urinalysis sample collection ?-Discontinue Cefepime and switch to Ceftriaxone. Continue Vancomycin ?-Follow-up blood and urine cultures ? ?Cellulitis ?Unsure of patient's erythema is related to cellulitis vs dermatitis. Patient with very dry skin. Sharp demarcation at sock line on admission. Erythema now resolved. ?-Continue antibiotics. See problem, Sepsis ?-Eucerin cream BID ? ?Hypertension ?Patient is on amlodipine as an outpatient. Blood pressure currently elevated. Antihypertensives held on admission ?-Restart home amlodipine ? ?Dementia ([place]) ?-Continue Seroquel ? ?Hypothyroidism ?-Continue Synthroid ? ?Chronic anemia ?Unsure of etiology. Baseline is around 10-11. Hemoglobin down to 9.1 today. Unknown etiology. Complicated by heparin use while inpatient. ?-Iron panel ?-CBC in AM ? ?Trochanteric fracture of right femur ([place]) ?History of fall in December 2022, but no known recent fall. Patient with a history of comminuted displaced fracture of the greater trochanter in 12/22, treated non-operatively. Non-displaced fracture could not be excluded on admission CT pelvis with recommendation for MRI. MRI confirms similar fracture. ?-Orthopedic surgery consult ? ?Paroxysmal atrial fibrillation  ([facility]) ?New onset. Likely precipitated by current infection. Rapid ventricular response on admission which has now resolved spontaneously. Started on heparin IV for stroke prophylaxis. Patient is on aspirin 325 mg for prior history of stroke. ?-Continue heparin IV, although likely will not continue anticoagulation on discharge ? ?Addendum: difficulty obtaining heparin levels. Patient is a high fall risk with history of falls and dementia. Will recommend to stop anticoagulation at this point. Will resume patient's aspirin 325 mg daily. ? ?UTI (urinary tract infection) ?Urinalysis suggests possible infection. Patient appears to have likely been on pyridium secondary to orange urine on urinalysis. Poor historian. Started on antibiotics for possible cellulitis and sepsis criteria met on admission. ? ?Pressure injury of skin ?Right heel. WOC recommendations (3/20): ?Dressing procedure/placement/frequency:  ?1. Clean right heel wound with saline, pat dry  ?2. Apply 1/4" thick layer of Santyl, top with 2x2 gauze moist with saline, not soaked\  ?3. Top with dry dressing, secure with kerlix  ?4. Offload with Prevalon boots as much as possible. ?5. Silicone foam the left lateral foot.  ? ? ? ?DVT prophylaxis: Heparin IV ?Code Status:   Code Status: DNR ?Family Communication: Son on telephone ?Disposition Plan: Discharge likely back to facility in 1-3 days ? ? ?Consultants:  ?Palliative care ? ?Procedures:  ?None ? ?Antimicrobials: ?Vancomycin ?Cefepime  ? ? ?Subjective: ?Patient reports no issues this morning other than with me startling her. ? ?Objective: ?BP (!) 150/133   Pulse 100   Temp 99.2 ?F (37.3 ?C) (Oral)   Resp (!) 21   Ht '5\' 11"'  (1.803 m)   Wt 69.9 kg   SpO2 96%   BMI 21.48 kg/m?  ? ?Examination: ? ?General exam: Appears calm and comfortable ?Respiratory system: Clear to auscultation. Respiratory effort normal. ?Cardiovascular system: S1 & S2 heard, RRR. ?Gastrointestinal system: Abdomen is nondistended,  soft and nontender. No organomegaly  or masses felt. Normal bowel sounds heard. ?Central nervous system: Alert and oriented to person. ?Musculoskeletal: No edema. No calf tenderness ? ? ?Data Reviewed: I have personally reviewed following labs and imaging studies ? ?CBC ?Lab Results  ?Component Value Date  ? WBC 9.7 07/10/2021  ? RBC 3.35 (L) 07/10/2021  ? HGB 9.1 (L) 07/10/2021  ? HCT 29.7 (L) 07/10/2021  ? MCV 88.7 07/10/2021  ? MCH 27.2 07/10/2021  ? PLT 261 07/10/2021  ? MCHC 30.6 07/10/2021  ? RDW 17.3 (H) 07/10/2021  ? LYMPHSABS 2.1 07/09/2021  ? MONOABS 1.4 (H) 07/09/2021  ? EOSABS 0.0 07/09/2021  ? BASOSABS 0.0 07/09/2021  ? ? ? ?Last metabolic panel ?Lab Results  ?Component Value Date  ? NA 131 (L) 07/09/2021  ? K 4.0 07/09/2021  ? CL 96 (L) 07/09/2021  ? CO2 25 07/09/2021  ? BUN 17 07/09/2021  ? CREATININE 0.62 07/09/2021  ? GLUCOSE 115 (H) 07/09/2021  ? GFRNONAA >60 07/09/2021  ? GFRAA >60 11/16/2018  ? CALCIUM 8.5 (L) 07/09/2021  ? PROT 7.4 07/09/2021  ? ALBUMIN 3.2 (L) 07/09/2021  ? BILITOT 1.2 07/09/2021  ? ALKPHOS 87 07/09/2021  ? AST 29 07/09/2021  ? ALT 13 07/09/2021  ? ANIONGAP 10 07/09/2021  ? ? ?GFR: ?Estimated Creatinine Clearance: 49.5 mL/min (by C-G formula based on SCr of 0.62 mg/dL). ? ?Recent Results (from the past 240 hour(s))  ?Resp Panel by RT-PCR (Flu A&B, Covid) Nasopharyngeal Swab     Status: None  ? Collection Time: 07/09/21  6:01 AM  ? Specimen: Nasopharyngeal Swab; Nasopharyngeal(NP) swabs in vial transport medium  ?Result Value Ref Range Status  ? SARS Coronavirus 2 by RT PCR NEGATIVE NEGATIVE Final  ?  Comment: (NOTE) ?SARS-CoV-2 target nucleic acids are NOT DETECTED. ? ?The SARS-CoV-2 RNA is generally detectable in upper respiratory ?specimens during the acute phase of infection. The lowest ?concentration of SARS-CoV-2 viral copies this assay can detect is ?138 copies/mL. A negative result does not preclude SARS-Cov-2 ?infection and should not be used as the sole basis for  treatment or ?other patient management decisions. A negative result may occur with  ?improper specimen collection/handling, submission of specimen other ?than nasopharyngeal swab, presence of viral mutation(s) within the ?areas targeted by this assay, and inadequate number of viral ?copies(<138 copies/mL). A negative result must be combined with ?clinical observations, patient history, and epidemiological ?information. The expected result is Negative. ? ?Fact Sheet for Patients:  ?EntrepreneurPulse.com.au ? ?Fact Sheet for Healthcare Providers:  ?IncredibleEmployment.be ? ?This test is no t yet approved or cleared by the [place] FDA and  ?has been authorized for detection and/or diagnosis of SARS-CoV-2 by ?FDA under an Emergency Use Authorization (EUA). This EUA will remain  ?in effect (meaning this test can be used) for the duration of the ?COVID-19 declaration under Section 564(b)(1) of the Act, 21 ?U.S.C.section 360bbb-3(b)(1), unless the authorization is terminated  ?or revoked sooner.  ? ? ?  ? Influenza A by PCR NEGATIVE NEGATIVE Final  ? Influenza B by PCR NEGATIVE NEGATIVE Final  ?  Comment: (NOTE) ?The Xpert Xpress SARS-CoV-2/FLU/RSV plus assay is intended as an aid ?in the diagnosis of influenza from Nasopharyngeal swab specimens and ?should not be used as a sole basis for treatment. Nasal washings and ?aspirates are unacceptable for Xpert Xpress SARS-CoV-2/FLU/RSV ?testing. ? ?Fact Sheet for Patients: ?EntrepreneurPulse.com.au ? ?Fact Sheet for Healthcare Providers: ?IncredibleEmployment.be ? ?This test is not yet approved or cleared by the [place] and ?has  been authorized for detection and/or diagnosis of SARS-CoV-2 by ?FDA under an Emergency Use Authorization (EUA). This EUA will remain ?in effect (meaning this test can be used) for the duration of the ?COVID-19 declaration under Section 564(b)(1) of the Act, 21  U.S.C. ?section 360bbb-3(b)(1), unless the authorization is terminated or ?revoked. ? ?Performed at [facility], [address] ?[place], [place] [address] ?  ?MRSA Next Gen by PCR, Nasal     Status: N

## 2021-07-10 NOTE — Progress Notes (Signed)
Palliative: ? ?HPI: 88 y.o. female  with past medical history of dementia, hypertension, hypothyroidism, anxiety, hearing loss admitted on 07/08/2021 from [facility] with pain to right hip and left shoulder with no reported falls. Diagnosed with sepsis likely from RLE cellulitis and right heel ulcer with new atrial fibrillation RVR. Noted conversation with son who indicates desire for antibiotics and IV fluids but comfort if decline.   ? ?I met today with Whitney Schmidt. She is resting. She awakens and speaks to me but is terse in her responses. She denies pain or complaint. Her breakfast is untouched and she tells me she is not hungry. I encouraged her to rest and consider eating in a little bit and she agrees. I reassure her that I have been speaking with Whitney Schmidt and he knows that we are taking good care of her and she does seem reassured by this.  ? ?I spoke with son, Whitney Schmidt. Whitney Schmidt and I discuss his mother's sepsis and right heel ulcer. Whitney Schmidt expresses surprise as he was unaware that she had any skin breakdown on her right heel. He does note that she had bilateral lower edema and knows this can contribute to skin breakdown but she has not been bed-bound. We discussed the importance of ambulating/activity instead of sedentary lifestyle as well as good nutrition and protein in order to heal wounds. Able to acknowledge that both of these may be a challenge and barrier to improvement. Whitney Schmidt is able to express surprise in how quickly she has shown physical decline and we were able to talk through this process. He is appreciative of the honest discussion of expectations and challenges lying ahead. He remains open to consideration of hospice to add to her care at [facility].  ? ?All questions/concerns addressed. Emotional support provided.  ? ?Exam: Sleepy. Awakens and will answer yes/no more consistently than yesterday. Just wants to sleep and be left alone. No distress. HR stable. Breathing regular, unlabored.   ? ?Plan: ?- Continue to optimize with conservative treatment.  ?- When stable plan to return to [facility] potentially with hospice support.  ? ?45 min ? ? , NP ?Palliative Medicine Team ?Pager [phone] (Please see [email or web address] for schedule) ?Team Phone [phone]  ? ? ?Greater than 50%  of this time was spent counseling and coordinating care related to the above assessment and plan   ?

## 2021-07-10 NOTE — Progress Notes (Signed)
Pt transferred to 4W. Report called. Pt's belongings collected. Diamond RN notified of right wrist being swollen & red. Felt warm to touch. Pt complaining of wrist hurting when touched. MD aware.  ?

## 2021-07-11 ENCOUNTER — Inpatient Hospital Stay (HOSPITAL_COMMUNITY): Payer: Medicare PPO

## 2021-07-11 DIAGNOSIS — Z515 Encounter for palliative care: Secondary | ICD-10-CM | POA: Diagnosis not present

## 2021-07-11 DIAGNOSIS — A419 Sepsis, unspecified organism: Secondary | ICD-10-CM | POA: Diagnosis not present

## 2021-07-11 DIAGNOSIS — Z7189 Other specified counseling: Secondary | ICD-10-CM | POA: Diagnosis not present

## 2021-07-11 DIAGNOSIS — G301 Alzheimer's disease with late onset: Secondary | ICD-10-CM | POA: Diagnosis not present

## 2021-07-11 LAB — BASIC METABOLIC PANEL
Anion gap: 10 (ref 5–15)
BUN: 20 mg/dL (ref 8–23)
CO2: 22 mmol/L (ref 22–32)
Calcium: 7.9 mg/dL — ABNORMAL LOW (ref 8.9–10.3)
Chloride: 99 mmol/L (ref 98–111)
Creatinine, Ser: 0.77 mg/dL (ref 0.44–1.00)
GFR, Estimated: >60 mL/min (ref >60)
Glucose, Bld: 106 mg/dL — ABNORMAL HIGH (ref 70–99)
Potassium: 3.3 mmol/L — ABNORMAL LOW (ref 3.5–5.1)
Sodium: 131 mmol/L — ABNORMAL LOW (ref 135–145)

## 2021-07-11 LAB — IRON AND TIBC
Iron: 14 ug/dL — ABNORMAL LOW (ref 28–170)
Saturation Ratios: 8 % — ABNORMAL LOW (ref 10.4–31.8)
TIBC: 167 ug/dL — ABNORMAL LOW (ref 250–450)
UIBC: 153 ug/dL

## 2021-07-11 LAB — FERRITIN: Ferritin: 309 ng/mL — ABNORMAL HIGH (ref 11–307)

## 2021-07-11 MED ORDER — POTASSIUM CHLORIDE CRYS ER 20 MEQ PO TBCR
40.0000 meq | EXTENDED_RELEASE_TABLET | Freq: Once | ORAL | Status: AC
  Administered 2021-07-11: 40 meq via ORAL
  Filled 2021-07-11: qty 2

## 2021-07-11 MED ORDER — ENOXAPARIN SODIUM 40 MG/0.4ML IJ SOSY
40.0000 mg | PREFILLED_SYRINGE | INTRAMUSCULAR | Status: DC
  Administered 2021-07-11 – 2021-07-12 (×2): 40 mg via SUBCUTANEOUS
  Filled 2021-07-11 (×2): qty 0.4

## 2021-07-11 MED ORDER — SULFAMETHOXAZOLE-TRIMETHOPRIM 800-160 MG PO TABS
1.0000 | ORAL_TABLET | Freq: Two times a day (BID) | ORAL | Status: DC
  Administered 2021-07-11 – 2021-07-14 (×7): 1 via ORAL
  Filled 2021-07-11 (×8): qty 1

## 2021-07-11 MED ORDER — OXYCODONE HCL 5 MG PO TABS
5.0000 mg | ORAL_TABLET | Freq: Four times a day (QID) | ORAL | Status: DC | PRN
Start: 2021-07-11 — End: 2021-07-15
  Administered 2021-07-11 – 2021-07-13 (×2): 5 mg via ORAL
  Filled 2021-07-11 (×4): qty 1

## 2021-07-11 NOTE — Progress Notes (Signed)
pt has yellow 3 muse due to Heart Rate 120 to 150,  She's in Afib. pt has been agitated d/t mitts being on.   ?

## 2021-07-11 NOTE — Progress Notes (Signed)
?   07/11/21 0114  ?Assess: MEWS Score  ?Temp 98.2 ?F (36.8 ?C)  ?BP 96/60  ?Pulse Rate (!) 105  ?Resp 12  ?Level of Consciousness Alert  ?SpO2 94 %  ?O2 Device Room Air  ?Assess: MEWS Score  ?MEWS Temp 0  ?MEWS Systolic 1  ?MEWS Pulse 1  ?MEWS RR 1  ?MEWS LOC 0  ?MEWS Score 3  ?MEWS Score Color Yellow  ?Assess: if the MEWS score is Yellow or Red  ?Were vital signs taken at a resting state? Yes  ?Focused Assessment Change from prior assessment (see assessment flowsheet)  ?Does the patient meet 2 or more of the SIRS criteria? No  ?Treat  ?MEWS Interventions Escalated (See documentation below)  ?Pain Scale Faces  ?Pain Score Asleep  ?Faces Pain Scale 2  ?Pain Type Acute pain;Chronic pain  ?Pain Location Wrist  ?Pain Orientation Left  ?Pain Descriptors / Indicators Aching  ?Pain Frequency Other (Comment)  ?Breathing 0  ?Negative Vocalization 0  ?Facial Expression 0  ?Take Vital Signs  ?Increase Vital Sign Frequency  Yellow: Q 2hr X 2 then Q 4hr X 2, if remains yellow, continue Q 4hrs  ?Escalate  ?MEWS: Escalate Yellow: discuss with charge nurse/RN and consider discussing with provider and RRT  ?Notify: Charge Nurse/RN  ?Name of Charge Nurse/RN Notified Pam  ?Date Charge Nurse/RN Notified 07/11/21  ?Time Charge Nurse/RN Notified 0045  ?Assess: SIRS CRITERIA  ?SIRS Temperature  0  ?SIRS Pulse 1  ?SIRS Respirations  0  ?SIRS WBC 0  ?SIRS Score Sum  1  ? ? ?

## 2021-07-11 NOTE — Evaluation (Signed)
Occupational Therapy Evaluation Patient Details Name: Whitney Schmidt MRN: [id number] DOB: 1926-09-17 Today's Date: 07/11/2021   History of Present Illness patient is a 88 year old female who presented with right hip and left shoulder pain. patient was found to have sepsis secondary to cellulitis of R heel and drug resistant E. coli UTI, subacute right femur fracturedementia, hypothyroidism, hearing difficulties, hypertension   Clinical Impression   Patient is a 88 year old female who was admitted for above.  Patient's evaluation was limited today with increased pain and lethargy. Patient was noted to have increased redness, pain and head on L wrist and index MCP with patient calling out with light touch of UE. Nurse made aware. Patient to trial OT during next session with hope to transition back to SNF at time of d/c from hospital with short term rehab before continuation into LTC. Patient would continue to benefit from skilled OT services at this time while admitted and after d/c to address noted deficits in order to improve overall safety and independence in ADLs.       Recommendations for follow up therapy are one component of a multi-disciplinary discharge planning process, led by the attending physician.  Recommendations may be updated based on patient status, additional functional criteria and insurance authorization.   Follow Up Recommendations  Skilled nursing-short term rehab (<3 hours/day)    Assistance Recommended at Discharge Frequent or constant Supervision/Assistance  Patient can return home with the following Two people to help with walking and/or transfers;Two people to help with bathing/dressing/bathroom;Direct supervision/assist for medications management;Help with stairs or ramp for entrance;Assist for transportation;Direct supervision/assist for financial management;Assistance with cooking/housework    Functional Status Assessment  Patient has had a recent decline in their  functional status and demonstrates the ability to make significant improvements in function in a reasonable and predictable amount of time.  Equipment Recommendations  None recommended by OT    Recommendations for Other Services       Precautions / Restrictions Precautions Precaution Comments: LUE pain Restrictions Weight Bearing Restrictions: No      Mobility Bed Mobility Overal bed mobility: Needs Assistance Bed Mobility: Rolling Rolling: Total assist         General bed mobility comments: patient calling out with pain with all attempts at movement. patient was TD for repositioning in bed into attempted chair position with pain and lethargy effecting participation    Transfers                          Balance Overall balance assessment: Needs assistance                                         ADL either performed or assessed with clinical judgement   ADL Overall ADL's : Needs assistance/impaired Eating/Feeding: Bed level   Grooming: Bed level;Total assistance   Upper Body Bathing: Bed level;Total assistance   Lower Body Bathing: Bed level;Total assistance   Upper Body Dressing : Bed level;Total assistance   Lower Body Dressing: Bed level;Total assistance   Toilet Transfer: +2 for safety/equipment;+2 for physical assistance Toilet Transfer Details (indicate cue type and reason): unable to tolerate chair position in bed on this date with increased pain with movement and lethargy Toileting- Clothing Manipulation and Hygiene: +2 for safety/equipment;+2 for physical assistance       Functional mobility during ADLs: +2 for  safety/equipment;+2 for physical assistance General ADL Comments: patient attempted chair position in bed with patient calling out with attempts to sit up past 45 degrees. patient was lethargic with all tasks with patient reporting she is awake then closing eyes and deep breathing. patient was TD with attempts at  washing face in modified chair position in bed. attempted to reposition UE to reduc eedema with patient calling out with slight touch to LUE and beign defencive of hand. noted to have redness and heat. patient's nurse made aware. nurse to communicate with MD     Vision   Additional Comments: unable to assess at this time with increased lethargy     Perception     Praxis      Pertinent Vitals/Pain Pain Assessment Pain Assessment: Faces Faces Pain Scale: Hurts worst Pain Location: L hand/wrist. Pain Descriptors / Indicators: Crying, Grimacing, Discomfort, Guarding, Sharp Pain Intervention(s): Limited activity within patient's tolerance, Monitored during session, Patient requesting pain meds-RN notified, Repositioned     Hand Dominance  (unknown at this time)   Extremity/Trunk Assessment Upper Extremity Assessment Upper Extremity Assessment: RUE deficits/detail;LUE deficits/detail RUE Deficits / Details: patient was able to participate in AAROM of this UE with increased time with no c/o pain. able to twitch hands to attempt grasping hand but not much effort put into it LUE Deficits / Details: unable to assess with patient noted to have increased redness and heat in L wrist and index MCPs. patient calling out with light touch to this UE. LUE: Unable to fully assess due to pain   Lower Extremity Assessment Lower Extremity Assessment: Defer to PT evaluation   Cervical / Trunk Assessment Cervical / Trunk Assessment: Kyphotic (noted to lean neck to L side. patient was able to shift closer to midline but not reach neutral at this time.)   Communication Communication Communication: HOH   Cognition Arousal/Alertness: Lethargic Behavior During Therapy: Flat affect Overall Cognitive Status: Difficult to assess                                 General Comments: patient was very lethargic at this time with patient falling asleep during session between cues. patient reported  being awake and responds to name but quickly falls back alseep     General Comments       Exercises     Shoulder Instructions      Home Living Family/patient expects to be discharged to:: Skilled nursing facility                                        Prior Functioning/Environment Prior Level of Function : Needs assist               ADLs Comments: patient unable to provide PLOF at this time. assune that patient had caregiver support for ADL tasks at SNF. per nurse report, patient was mobile at SNF        OT Problem List: Decreased activity tolerance;Decreased range of motion;Impaired balance (sitting and/or standing);Pain;Impaired UE functional use;Cardiopulmonary status limiting activity;Decreased safety awareness;Decreased knowledge of precautions;Decreased knowledge of use of DME or AE;Decreased coordination      OT Treatment/Interventions: Self-care/ADL training;Therapeutic exercise;Neuromuscular education;Energy conservation;DME and/or AE instruction;Therapeutic activities;Balance training;Patient/family education    OT Goals(Current goals can be found in the care plan section) Acute Rehab OT Goals Patient Stated  Goal: none stated OT Goal Formulation: Patient unable to participate in goal setting Time For Goal Achievement: July 27, 2021 Potential to Achieve Goals: Good  OT Frequency: Min 1X/week    Co-evaluation              AM-PAC OT "6 Clicks" Daily Activity     Outcome Measure Help from another person eating meals?: A Lot Help from another person taking care of personal grooming?: A Lot Help from another person toileting, which includes using toliet, bedpan, or urinal?: Total Help from another person bathing (including washing, rinsing, drying)?: Total Help from another person to put on and taking off regular upper body clothing?: Total Help from another person to put on and taking off regular lower body clothing?: Total 6 Click Score: 8    End of Session Nurse Communication: Patient requests pain meds  Activity Tolerance: Patient limited by lethargy;Patient limited by pain;Patient limited by fatigue Patient left: in bed;with call bell/phone within reach;with bed alarm set  OT Visit Diagnosis: Unsteadiness on feet (R26.81);Muscle weakness (generalized) (M62.81);Pain                Time: 1436-1450 OT Time Calculation (min): 14 min Charges:  OT General Charges $OT Visit: 1 Visit OT Evaluation $OT Eval Moderate Complexity: 1 Mod  Sharyn Blitz OTR/L, MS Acute Rehabilitation Department Office# [phone] Pager# [phone]   Ardyth Harps 07/11/2021, 3:13 PM

## 2021-07-11 NOTE — TOC Progression Note (Signed)
Transition of Care (TOC) - Progression Note  ? ? ?Patient Details  ?Name: Whitney Schmidt ?MRN: [id number] ?Date of Birth: Feb 25, 1927 ? ?Transition of Care (TOC) CM/SW Contact  ?Quill Grinder Aris Lot, LCSW ?Phone Number: ?07/11/2021, 11:14 AM ? ?Clinical Narrative:    ? ?CSW spoke with Renee Harder, [phone], with [facility]. She confirmed pt is LTC with them and would be able to return with Hospice if desired. [facility] prefers to [facility]. TOC will follow for DC needs.  ? ?Expected Discharge Plan: Skilled Nursing Facility ?Barriers to Discharge: Continued Medical Work up ? ?Expected Discharge Plan and Services ?Expected Discharge Plan: Skilled Nursing Facility ?In-house Referral: Clinical Social Work ?  ?Post Acute Care Choice: Skilled Nursing Facility ?Living arrangements for the past 2 months: Skilled Nursing Facility ?                ?DME Arranged: N/A ?DME Agency: NA ?  ?  ?  ?  ?  ?  ?  ?  ? ? ?Social Determinants of Health (SDOH) Interventions ?  ? ?Readmission Risk Interventions ?   ? View : No data to display.  ?  ?  ?  ? ? ?

## 2021-07-11 NOTE — Progress Notes (Signed)
Palliative: ? ?HPI: 88 y.o. female  with past medical history of dementia, hypertension, hypothyroidism, anxiety, hearing loss admitted on 07/08/2021 from [facility] with pain to right hip and left shoulder with no reported falls. Diagnosed with sepsis likely from RLE cellulitis and right heel ulcer with new atrial fibrillation RVR. Noted conversation with son who indicates desire for antibiotics and IV fluids but comfort if decline.   ? ?I met today at Whitney Schmidt's room along with her son Whitney Schmidt and daughter Whitney Schmidt. Ms. Whitney Schmidt was able to take medications in applesauce but would not eat breakfast for RN. Lunch is at bedside and with her family's assistance she ate all her fruit and at least half of her chicken salad sandwich as well as asking for more ginger ale. She does also seem more alert today. Family is hopeful that she is feeling better from her infection and that she is trying to turn around. They also understand that with her underlying dementia this may continue to wax and wane. We discussed ongoing fracture and that orthopedics is going to come by to re-evaluate. They both confirm that they would NOT desire any surgical intervention acknowledging that she is unlikely to recover. If continued recommendations from orthopedics allows they would want a trial of rehab if she continues to remain alert and be able to tolerate more intake. They would opt for hospice care at [facility] if she declines or fails rehab trial.  ? ?All questions/concerns addressed. Emotional support provided. Updated Dr. Maylene Roes, RN, TOC.  ? ?Exam: More alert. Baseline confusion. No distress. Aches and pains with movement in bed. HR regular rate. Breathing regular, unlabored. Abd soft. Generalized weakness.  ? ?Plan: ?- No surgery desired.  ?- Continue conservative treatment and proceed with rehab trial at [facility] as able. If decline or if she fails rehab trial family would opt to add hospice support. Palliative care  outpatient to follow.  ? ?50 min ? ?Vinie Sill, NP ?Palliative Medicine Team ?Pager [phone] (Please see [email or web address] for schedule) ?Team Phone [phone]  ? ? ?Greater than 50%  of this time was spent counseling and coordinating care related to the above assessment and plan   ?

## 2021-07-11 NOTE — Progress Notes (Signed)
?PROGRESS NOTE ? ? ? ?Briyonna Omara  XID:[id number] DOB: 1926/07/28 DOA: 07/08/2021 ?PCP: Hennie Duos, MD  ? ?  ?Brief Narrative:  ?Whitney Schmidt is a 88 y.o. female with a history of dementia, hypothyroidism, hearing difficulties, hypertension. Patient presented secondary to right hip and left shoulder pain of unknown etiology. While admitted, she met sepsis criteria with concern for cellulitis. She was also found have developed atrial fibrillation with RVR. Empiric antibiotics initiated. Heparin IV initiated, which was transitioned to Lovenox and aspirin.  ? ?New events last 24 hours / Subjective: ?Patient remains at her baseline dementia, hard of hearing. No appetite and did not eat breakfast.  ? ?Assessment & Plan: ?  ?Principal Problem: ?  Sepsis ([place]) ?Active Problems: ?  Cellulitis ?  Dementia ([place]) ?  Hypertension ?  Hypothyroidism ?  Pressure injury of skin ?  UTI (urinary tract infection) ?  Paroxysmal atrial fibrillation (HCC) ?  Trochanteric fracture of right femur ([place]) ?  Chronic anemia ? ? ?Sepsis secondary to presumed wound/cellulitis of the right heel and drug-resistant E. coli UTI ?-Discussed with pharmacy, de-escalate vancomycin/ceftriaxone to Bactrim ?-WOC RN for wound care of right heel ?-Blood cultures are negative ? ?Subacute right femur fracture ?-History of fall in December 2022 which revealed comminuted displaced fracture of the greater trochanter December 2022.  Case discussed with Dr. Lucia Gaskins on-call orthopedic surgery who reviewed images and agreed that this was a nonoperative case and needed to be treated with weightbearing as tolerated.  She was returned back to her skilled nursing facility at that time. ?-MRI pelvis revealed subacute coming infection of the right greater trochanter ?-PT OT  ? ?Paroxysmal A-fib, new onset ?-Continue aspirin ? ?Dementia ?-Aricept, Seroquel ? ?Hypothyroidism ?-Synthroid ? ?Hypokalemia ?-Replace and trend ? ? ? ?In agreement with assessment of the pressure  ulcer as below:  ?Pressure Injury 07/09/21 Heel Right;Posterior Stage 3 -  Full thickness tissue loss. Subcutaneous fat may be visible but bone, tendon or muscle are NOT exposed. (Active)  ?07/09/21 0855  ?Location: Heel  ?Location Orientation: Right;Posterior  ?Staging: Stage 3 -  Full thickness tissue loss. Subcutaneous fat may be visible but bone, tendon or muscle are NOT exposed.  ?Wound Description (Comments):   ?Present on Admission: Yes  ?Dressing Type Gauze (Comment) 07/10/21 2241  ?   ?Pressure Injury 07/09/21 Foot Left Stage 2 -  Partial thickness loss of dermis presenting as a shallow open injury with a red, pink wound bed without slough. (Active)  ?07/09/21 0855  ?Location: Foot  ?Location Orientation: Left  ?Staging: Stage 2 -  Partial thickness loss of dermis presenting as a shallow open injury with a red, pink wound bed without slough.  ?Wound Description (Comments):   ?Present on Admission: Yes  ?Dressing Type Foam - Lift dressing to assess site every shift 07/10/21 2241  ?   ?Pressure Injury 07/09/21 Coccyx Medial Stage 1 -  Intact skin with non-blanchable redness of a localized area usually over a bony prominence. Pink (Active)  ?07/09/21 1009  ?Location: Coccyx  ?Location Orientation: Medial  ?Staging: Stage 1 -  Intact skin with non-blanchable redness of a localized area usually over a bony prominence.  ?Wound Description (Comments): Pink  ?Present on Admission: Yes  ?Dressing Type Santyl;Foam - Lift dressing to assess site every shift 07/10/21 2241  ? ? ? ?  ? ? ?DVT prophylaxis: Lovenox  ? ? ?Code Status: DNR ?Family Communication: None at bedside  ?Disposition Plan:  ?Status is: Inpatient ?Remains inpatient  appropriate because: Need PT, increase in PO vs hospice  ? ? ?Antimicrobials:  ?Anti-infectives (From admission, onward)  ? ? Start     Dose/Rate Route Frequency Ordered Stop  ? 07/11/21 1215  sulfamethoxazole-trimethoprim (BACTRIM DS) 800-160 MG per tablet 1 tablet       ? 1 tablet Oral  Every 12 hours 07/11/21 1124    ? 07/10/21 1700  cefTRIAXone (ROCEPHIN) 2 g in sodium chloride 0.9 % 100 mL IVPB  Status:  Discontinued       ? 2 g ?200 mL/hr over 30 Minutes Intravenous Every 24 hours 07/10/21 1134 07/11/21 1124  ? 07/10/21 0600  vancomycin (VANCOREADY) IVPB 1250 mg/250 mL  Status:  Discontinued       ? 1,250 mg ?166.7 mL/hr over 90 Minutes Intravenous Every 24 hours 07/09/21 0541 07/11/21 1124  ? 07/09/21 1700  ceFEPIme (MAXIPIME) 2 g in sodium chloride 0.9 % 100 mL IVPB  Status:  Discontinued       ? 2 g ?200 mL/hr over 30 Minutes Intravenous Every 12 hours 07/09/21 0538 07/10/21 1134  ? 07/09/21 0415  vancomycin (VANCOREADY) IVPB 1500 mg/300 mL       ? 1,500 mg ?150 mL/hr over 120 Minutes Intravenous  Once 07/09/21 0410 07/09/21 0800  ? 07/09/21 0415  ceFEPIme (MAXIPIME) 2 g in sodium chloride 0.9 % 100 mL IVPB       ? 2 g ?200 mL/hr over 30 Minutes Intravenous  Once 07/09/21 0410 07/09/21 8453  ? ?  ? ? ? ?Objective: ?Vitals:  ? 07/11/21 0247 07/11/21 0500 07/11/21 0539 07/11/21 1304  ?BP: (!) 107/59 111/61 (!) 92/52 (!) 97/50  ?Pulse: (!) 103 80 95 98  ?Resp: '18 20 20 ' (!) 22  ?Temp: 98 ?F (36.7 ?C) 99 ?F (37.2 ?C) 98.8 ?F (37.1 ?C) 97.7 ?F (36.5 ?C)  ?TempSrc:   Oral Oral  ?SpO2: 95% 96% 94% 98%  ?Weight:      ?Height:      ? ? ?Intake/Output Summary (Last 24 hours) at 07/11/2021 1338 ?Last data filed at 07/10/2021 2200 ?Gross per 24 hour  ?Intake 152.57 ml  ?Output 150 ml  ?Net 2.57 ml  ? ?Filed Weights  ? 07/08/21 2123  ?Weight: 69.9 kg  ? ? ?Examination:  ?General exam: Appears calm, HOH  ?Respiratory system: Clear to auscultation. Respiratory effort normal. No respiratory distress.  ?Cardiovascular system: S1 & S2 heard, irreg rhythm 94. No murmurs. No pedal edema. ?Gastrointestinal system: Abdomen is nondistended, soft and nontender. Normal bowel sounds heard. ?Central nervous system: Alert  ?Skin: Right heel with sloughing wound  ? ?Data Reviewed: I have personally reviewed following labs  and imaging studies ? ?CBC: ?Recent Labs  ?Lab 07/09/21 ?0045 07/10/21 ?6468  ?WBC 10.1 9.7  ?NEUTROABS 6.6  --   ?HGB 10.2* 9.1*  ?HCT 32.8* 29.7*  ?MCV 89.1 88.7  ?PLT 285 261  ? ?Basic Metabolic Panel: ?Recent Labs  ?Lab 07/09/21 ?0045 07/11/21 ?0345  ?NA 131* 131*  ?K 4.0 3.3*  ?CL 96* 99  ?CO2 25 22  ?GLUCOSE 115* 106*  ?BUN 17 20  ?CREATININE 0.62 0.77  ?CALCIUM 8.5* 7.9*  ? ?GFR: ?Estimated Creatinine Clearance: 49.5 mL/min (by C-G formula based on SCr of 0.77 mg/dL). ?Liver Function Tests: ?Recent Labs  ?Lab 07/09/21 ?0045  ?AST 29  ?ALT 13  ?ALKPHOS 87  ?BILITOT 1.2  ?PROT 7.4  ?ALBUMIN 3.2*  ? ?No results for input(s): LIPASE, AMYLASE in the last 168 hours. ?No results for  input(s): AMMONIA in the last 168 hours. ?Coagulation Profile: ?Recent Labs  ?Lab 07/09/21 ?7939  ?INR 1.5*  ? ?Cardiac Enzymes: ?Recent Labs  ?Lab 07/09/21 ?0045  ?CKTOTAL 57  ? ?BNP (last 3 results) ?No results for input(s): PROBNP in the last 8760 hours. ?HbA1C: ?No results for input(s): HGBA1C in the last 72 hours. ?CBG: ?No results for input(s): GLUCAP in the last 168 hours. ?Lipid Profile: ?No results for input(s): CHOL, HDL, LDLCALC, TRIG, CHOLHDL, LDLDIRECT in the last 72 hours. ?Thyroid Function Tests: ?Recent Labs  ?  07/09/21 ?0829  ?TSH 2.669  ? ?Anemia Panel: ?Recent Labs  ?  07/11/21 ?0345  ?FERRITIN 309*  ?TIBC 167*  ?IRON 14*  ? ?Sepsis Labs: ?Recent Labs  ?Lab 07/09/21 ?6886 07/09/21 ?4847  ?LATICACIDVEN 2.3* 1.4  ? ? ?Recent Results (from the past 240 hour(s))  ?Blood culture (routine x 2)     Status: None (Preliminary result)  ? Collection Time: 07/09/21  5:11 AM  ? Specimen: BLOOD  ?Result Value Ref Range Status  ? Specimen Description   Final  ?  BLOOD BLOOD RIGHT FOREARM ?Performed at [facility], [place] [address], [place], [place] [address] ?  ? Special Requests   Final  ?  BOTTLES DRAWN AEROBIC ONLY Blood Culture results may not be optimal due to an inadequate volume of blood received in culture  bottles ?Performed at [facility], [place] [address], [place], [place] [address] ?  ? Culture   Final  ?  NO GROWTH 2 DAYS ?Performed at [facility], [place] [address] [place]

## 2021-07-12 DIAGNOSIS — A419 Sepsis, unspecified organism: Secondary | ICD-10-CM | POA: Diagnosis not present

## 2021-07-12 LAB — BASIC METABOLIC PANEL
Anion gap: 7 (ref 5–15)
BUN: 28 mg/dL — ABNORMAL HIGH (ref 8–23)
CO2: 24 mmol/L (ref 22–32)
Calcium: 8.1 mg/dL — ABNORMAL LOW (ref 8.9–10.3)
Chloride: 102 mmol/L (ref 98–111)
Creatinine, Ser: 0.76 mg/dL (ref 0.44–1.00)
GFR, Estimated: >60 mL/min (ref >60)
Glucose, Bld: 107 mg/dL — ABNORMAL HIGH (ref 70–99)
Potassium: 3.6 mmol/L (ref 3.5–5.1)
Sodium: 133 mmol/L — ABNORMAL LOW (ref 135–145)

## 2021-07-12 LAB — MAGNESIUM: Magnesium: 2.1 mg/dL (ref 1.7–2.4)

## 2021-07-12 MED ORDER — ALPRAZOLAM 0.25 MG PO TABS
0.2500 mg | ORAL_TABLET | Freq: Two times a day (BID) | ORAL | Status: DC | PRN
  Administered 2021-07-14: 0.25 mg via ORAL
  Filled 2021-07-12: qty 1

## 2021-07-12 MED ORDER — METOPROLOL TARTRATE 25 MG PO TABS
12.5000 mg | ORAL_TABLET | Freq: Two times a day (BID) | ORAL | Status: DC
  Administered 2021-07-12 – 2021-07-14 (×4): 12.5 mg via ORAL
  Filled 2021-07-12 (×5): qty 1

## 2021-07-12 MED ORDER — SODIUM CHLORIDE 0.9 % IV SOLN: INTRAVENOUS | Status: DC

## 2021-07-12 NOTE — Progress Notes (Signed)
PT Cancellation Note ? ?Patient Details ?Name: Whitney Schmidt ?MRN: [id number] ?DOB: 1926/05/16 ? ? ?Cancelled Treatment:    Reason Eval/Treat Not Completed: Patient declined, no reason specified (pt refused therapy despite attempt to encourage mobility. "Leave".."get out".Marland Kitchen"I'll be here tomorrow". Will follow up at later date/time as schedule allows. Pt may do more with family present.) ? ? ?Renaldo Fiddler PT, DPT ?Acute Rehabilitation Services ?Office [phone] ?Pager [phone] ? ?

## 2021-07-12 NOTE — TOC Progression Note (Signed)
Transition of Care (TOC) - Progression Note  ? ? ?Patient Details  ?Name: Whitney Schmidt ?MRN: [id number] ?Date of Birth: 1927/01/06 ? ?Transition of Care (TOC) CM/SW Contact  ?Annamay Laymon Aris Lot, LCSW ?Phone Number: ?07/12/2021, 2:17 PM ? ?Clinical Narrative:    ? ?CSW contacted pt's son to discuss disposition. Son confirmed plan for pt to return to [facility] and try rehab with palliative following rather than hospice. Son is agreeable to referral to [facility] for OP palliative is that is the company [facility] uses at their facility. CSW confirmed with [facility] liaison that pt could return but auth would need to be received first. CSW submitted auth with [facility] though they will likely need recent therapy notes and pt is refusing therapies today. CSW spoke with [facility] liaison and explained that pt is unlikely to get auth due to being close to baseline and refusing therapies and requested that pt can return with Auth pending since she is LTC there. [facility] liaison agrees to this plan  though pt is no longer medically ready. CSW notified [facility] that pt will not  DC today.  ? ? ?Expected Discharge Plan: Skilled Nursing Facility ?Barriers to Discharge: Continued Medical Work up ? ?Expected Discharge Plan and Services ?Expected Discharge Plan: Skilled Nursing Facility ?In-house Referral: Clinical Social Work ?  ?Post Acute Care Choice: Skilled Nursing Facility ?Living arrangements for the past 2 months: Skilled Nursing Facility ?                ?DME Arranged: N/A ?DME Agency: NA ?  ?  ?  ?  ?  ?  ?  ?  ? ? ?Social Determinants of Health (SDOH) Interventions ?  ? ?Readmission Risk Interventions ?   ? View : No data to display.  ?  ?  ?  ? ? ?

## 2021-07-12 NOTE — Progress Notes (Signed)
OT Cancellation Note ? ?Patient Details ?Name: Whitney Schmidt ?MRN: [id number] ?DOB: 13-May-1926 ? ? ?Cancelled Treatment:    Reason Eval/Treat Not Completed: Patient declined, no reason specified. Patient refused therapy. Patient could not be persuaded and was adamantly refusing and somewhat hostile. Will f/u as able. ? ?Kelli Churn ?07/12/2021, 10:39 AM ?

## 2021-07-12 NOTE — Progress Notes (Addendum)
Patient declining cares today. Re-approached multiple times by different staff in attempt to provide care. Called patient son and patient and son were able to talk on the phone. Patient continues to decline cares, food, medication, and dressing treatment. Will continue to re-approach. Provider notified of patients disposition.  ?

## 2021-07-12 NOTE — Progress Notes (Addendum)
?PROGRESS NOTE ? ? ? ?Whitney Schmidt  ACZ:[id number] DOB: 26-Jul-1926 DOA: 07/08/2021 ?PCP: Hennie Duos, MD  ? ?  ?Brief Narrative:  ?Whitney Schmidt is a 88 y.o. female with a history of dementia, hypothyroidism, hearing difficulties, hypertension. Patient presented secondary to right hip and left shoulder pain of unknown etiology. While admitted, she met sepsis criteria with concern for cellulitis. She was also found have developed atrial fibrillation with RVR. Empiric antibiotics initiated. Heparin IV initiated, which was transitioned to Lovenox and aspirin.  ? ?New events last 24 hours / Subjective: ?Resting comfortably.  When awakened is agitated, refusing examination, wants to be left alone. Has been refusing care and food and medications today.  ? ?Assessment & Plan: ?  ?Principal Problem: ?  Sepsis ([place]) ?Active Problems: ?  Cellulitis ?  Dementia ([place]) ?  Hypertension ?  Hypothyroidism ?  Pressure injury of skin ?  UTI (urinary tract infection) ?  Paroxysmal atrial fibrillation (HCC) ?  Trochanteric fracture of right femur ([place]) ?  Chronic anemia ? ? ?Sepsis secondary to presumed wound/cellulitis of the right heel and drug-resistant E. coli UTI ?-Discussed with pharmacy, de-escalate vancomycin/ceftriaxone to Bactrim ?-WOC RN for wound care of right heel ?-Blood cultures are negative ? ?Subacute right femur fracture ?-History of fall in December 2022 which revealed comminuted displaced fracture of the greater trochanter December 2022.  Case discussed with Dr. Lucia Gaskins on-call orthopedic surgery who reviewed images and agreed that this was a nonoperative case and needed to be treated with weightbearing as tolerated.  She was returned back to her skilled nursing facility at that time. ?-MRI pelvis revealed subacute coming infection of the right greater trochanter ?-PT OT  ? ?Paroxysmal A-fib, new onset ?-Continue aspirin, lopressor  ? ?Dementia ?-Aricept, Seroquel ? ?Hypothyroidism ?-Synthroid ? ?Goals of  care ?-Patient declining all care, food, medications today. Had an extended discussion over the phone with son >25 min regarding disposition (SNF vs hospice). With patient declining PO intake, son and I are in agreement that patient should stay in hospital for care. Start IVF today. If patient continues to decline intake, she would qualify for hospice. Will have to determine how things go next 24 hours.  ? ? ? ? ?In agreement with assessment of the pressure ulcer as below:  ?Pressure Injury 07/09/21 Heel Right;Posterior Stage 3 -  Full thickness tissue loss. Subcutaneous fat may be visible but bone, tendon or muscle are NOT exposed. (Active)  ?07/09/21 0855  ?Location: Heel  ?Location Orientation: Right;Posterior  ?Staging: Stage 3 -  Full thickness tissue loss. Subcutaneous fat may be visible but bone, tendon or muscle are NOT exposed.  ?Wound Description (Comments):   ?Present on Admission: Yes  ?Dressing Type Gauze (Comment);Normal saline moist dressing;Santyl;Moist to dry 07/11/21 2233  ?   ?Pressure Injury 07/09/21 Foot Left Stage 2 -  Partial thickness loss of dermis presenting as a shallow open injury with a red, pink wound bed without slough. (Active)  ?07/09/21 0855  ?Location: Foot  ?Location Orientation: Left  ?Staging: Stage 2 -  Partial thickness loss of dermis presenting as a shallow open injury with a red, pink wound bed without slough.  ?Wound Description (Comments):   ?Present on Admission: Yes  ?Dressing Type Foam - Lift dressing to assess site every shift 07/11/21 2233  ?   ?Pressure Injury 07/09/21 Coccyx Medial Stage 1 -  Intact skin with non-blanchable redness of a localized area usually over a bony prominence. Pink (Active)  ?07/09/21 1009  ?  Location: Coccyx  ?Location Orientation: Medial  ?Staging: Stage 1 -  Intact skin with non-blanchable redness of a localized area usually over a bony prominence.  ?Wound Description (Comments): Pink  ?Present on Admission: Yes  ?Dressing Type Foam - Lift  dressing to assess site every shift 07/11/21 2233  ? ? ? ?  ? ? ?DVT prophylaxis:  ?enoxaparin (LOVENOX) injection 40 mg Start: 07/11/21 1600 ? ?Code Status: DNR ?Family Communication: None at bedside  ?Disposition Plan:  ?Status is: Inpatient ?Remains inpatient appropriate because: Need PT, possibly SNF placement versus hospice ? ? ?Antimicrobials:  ?Anti-infectives (From admission, onward)  ? ? Start     Dose/Rate Route Frequency Ordered Stop  ? 07/11/21 1215  sulfamethoxazole-trimethoprim (BACTRIM DS) 800-160 MG per tablet 1 tablet       ? 1 tablet Oral Every 12 hours 07/11/21 1124    ? 07/10/21 1700  cefTRIAXone (ROCEPHIN) 2 g in sodium chloride 0.9 % 100 mL IVPB  Status:  Discontinued       ? 2 g ?200 mL/hr over 30 Minutes Intravenous Every 24 hours 07/10/21 1134 07/11/21 1124  ? 07/10/21 0600  vancomycin (VANCOREADY) IVPB 1250 mg/250 mL  Status:  Discontinued       ? 1,250 mg ?166.7 mL/hr over 90 Minutes Intravenous Every 24 hours 07/09/21 0541 07/11/21 1124  ? 07/09/21 1700  ceFEPIme (MAXIPIME) 2 g in sodium chloride 0.9 % 100 mL IVPB  Status:  Discontinued       ? 2 g ?200 mL/hr over 30 Minutes Intravenous Every 12 hours 07/09/21 0538 07/10/21 1134  ? 07/09/21 0415  vancomycin (VANCOREADY) IVPB 1500 mg/300 mL       ? 1,500 mg ?150 mL/hr over 120 Minutes Intravenous  Once 07/09/21 0410 07/09/21 0800  ? 07/09/21 0415  ceFEPIme (MAXIPIME) 2 g in sodium chloride 0.9 % 100 mL IVPB       ? 2 g ?200 mL/hr over 30 Minutes Intravenous  Once 07/09/21 0410 07/09/21 1700  ? ?  ? ? ? ?Objective: ?Vitals:  ? 07/11/21 1733 07/11/21 2217 07/12/21 0107 07/12/21 0500  ?BP: 107/61 109/69 (!) 101/58 115/73  ?Pulse: (!) 101 (!) 104 (!) 125 (!) 120  ?Resp: _0 ?Temp: 97.8 ?F (36.6 ?C) 97.6 ?F (36.4 ?C) 99.3 ?F (37.4 ?C) 99.2 ?F (37.3 ?C)  ?TempSrc: Oral  Axillary Oral  ?SpO2: 100% 97% 96% 97%  ?Weight:      ?Height:      ? ? ?Intake/Output Summary (Last 24 hours) at 07/12/2021 1041 ?Last data filed at 07/12/2021 0500 ?Gross  per 24 hour  ?Intake 240 ml  ?Output 200 ml  ?Net 40 ml  ? ? ?Filed Weights  ? 07/08/21 2123  ?Weight: 69.9 kg  ? ? ?Examination: Patient refused examination today ? ?Data Reviewed: I have personally reviewed following labs and imaging studies ? ?CBC: ?Recent Labs  ?Lab 07/09/21 ?0045 07/10/21 ?1749  ?WBC 10.1 9.7  ?NEUTROABS 6.6  --   ?HGB 10.2* 9.1*  ?HCT 32.8* 29.7*  ?MCV 89.1 88.7  ?PLT 285 261  ? ? ?Basic Metabolic Panel: ?Recent Labs  ?Lab 07/09/21 ?0045 07/11/21 ?0345 07/12/21 ?0336  ?NA 131* 131* 133*  ?K 4.0 3.3* 3.6  ?CL 96* 99 102  ?CO2 _1 ?GLUCOSE 115* 106* 107*  ?BUN 17 20 28*  ?CREATININE 0.62 0.77 0.76  ?CALCIUM 8.5* 7.9* 8.1*  ?MG  --   --  2.1  ? ? ?GFR: ?Estimated Creatinine  Clearance: 49.5 mL/min (by C-G formula based on SCr of 0.76 mg/dL). ?Liver Function Tests: ?Recent Labs  ?Lab 07/09/21 ?0045  ?AST 29  ?ALT 13  ?ALKPHOS 87  ?BILITOT 1.2  ?PROT 7.4  ?ALBUMIN 3.2*  ? ? ?No results for input(s): LIPASE, AMYLASE in the last 168 hours. ?No results for input(s): AMMONIA in the last 168 hours. ?Coagulation Profile: ?Recent Labs  ?Lab 07/09/21 ?1740  ?INR 1.5*  ? ? ?Cardiac Enzymes: ?Recent Labs  ?Lab 07/09/21 ?0045  ?CKTOTAL 57  ? ? ?BNP (last 3 results) ?No results for input(s): PROBNP in the last 8760 hours. ?HbA1C: ?No results for input(s): HGBA1C in the last 72 hours. ?CBG: ?No results for input(s): GLUCAP in the last 168 hours. ?Lipid Profile: ?No results for input(s): CHOL, HDL, LDLCALC, TRIG, CHOLHDL, LDLDIRECT in the last 72 hours. ?Thyroid Function Tests: ?No results for input(s): TSH, T4TOTAL, FREET4, T3FREE, THYROIDAB in the last 72 hours. ? ?Anemia Panel: ?Recent Labs  ?  07/11/21 ?0345  ?FERRITIN 309*  ?TIBC 167*  ?IRON 14*  ? ? ?Sepsis Labs: ?Recent Labs  ?Lab 07/09/21 ?9927 07/09/21 ?8004  ?LATICACIDVEN 2.3* 1.4  ? ? ? ?Recent Results (from the past 240 hour(s))  ?Blood culture (routine x 2)     Status: None (Preliminary result)  ? Collection Time: 07/09/21  5:11 AM  ? Specimen:  BLOOD  ?Result Value Ref Range Status  ? Specimen Description   Final  ?  BLOOD BLOOD RIGHT FOREARM ?Performed at [facility], [place] [address], [place], [place] [address] ?  ? Special Requests

## 2021-07-13 ENCOUNTER — Inpatient Hospital Stay (HOSPITAL_COMMUNITY): Payer: Medicare PPO

## 2021-07-13 DIAGNOSIS — I4891 Unspecified atrial fibrillation: Secondary | ICD-10-CM | POA: Diagnosis not present

## 2021-07-13 LAB — ECHOCARDIOGRAM COMPLETE
AR max vel: 1.83 cm2
AV Peak grad: 8 mmHg
Ao pk vel: 1.42 m/s
Area-P 1/2: 8.43 cm2
Height: 71 in
S' Lateral: 2.4 cm
Weight: 2464 [oz_av]

## 2021-07-13 NOTE — Progress Notes (Addendum)
?PROGRESS NOTE ? ? ? ?Whitney Schmidt  RCV:[id number] DOB: 04-27-1926 DOA: 07/08/2021 ?PCP: Hennie Duos, MD  ? ?  ?Brief Narrative:  ?Whitney Schmidt is a 88 y.o. female with a history of dementia, hypothyroidism, hearing difficulties, hypertension. Patient presented secondary to right hip and left shoulder pain of unknown etiology. While admitted, she met sepsis criteria with concern for cellulitis. She was also found have developed atrial fibrillation with RVR. Empiric antibiotics initiated. Heparin IV initiated, which was transitioned to Lovenox and aspirin.  Hospitalization further complicated by dementia and decreased oral intake. ? ?New events last 24 hours / Subjective: ?Per nursing report, was able to eat late in the day yesterday.  On examination today, patient is more pleasant, remains very confused, talking about a candle outside ? ?Assessment & Plan: ?  ?Principal Problem: ?  Sepsis ([place]) ?Active Problems: ?  Cellulitis ?  Dementia ([place]) ?  Hypertension ?  Hypothyroidism ?  Pressure injury of skin ?  UTI (urinary tract infection) ?  Paroxysmal atrial fibrillation ([facility]) ?  Trochanteric fracture of right femur ([place]) ?  Chronic anemia ? ? ?Sepsis secondary to presumed wound/cellulitis of the right heel and drug-resistant E. coli UTI ?-Discussed with pharmacy, de-escalate vancomycin/ceftriaxone to Bactrim ?-WOC RN for wound care of right heel ?-Blood cultures are negative ? ?Subacute right femur fracture ?-History of fall in December 2022 which revealed comminuted displaced fracture of the greater trochanter December 2022.  Case discussed with Dr. Lucia Gaskins on-call orthopedic surgery who reviewed images and agreed that this was a nonoperative case and needed to be treated with weightbearing as tolerated.  She was returned back to her skilled nursing facility at that time. ?-MRI pelvis revealed subacute comminuted fracture of the right greater trochanter ?-PT OT  ? ?Paroxysmal A-fib, new onset ?-Continue aspirin,  lopressor  ? ?Dementia ?-Aricept, Seroquel ? ?Hypothyroidism ?-Synthroid ? ?Goals of care ?-Continue palliative care conversations.  If patient continues to decline food and medication, nursing care, patient may qualify for hospice instead of SNF. ? ?Decreased oral intake ?-Continue IV fluid ? ? ?In agreement with assessment of the pressure ulcer as below:  ?Pressure Injury 07/09/21 Heel Right;Posterior Stage 3 -  Full thickness tissue loss. Subcutaneous fat may be visible but bone, tendon or muscle are NOT exposed. (Active)  ?07/09/21 0855  ?Location: Heel  ?Location Orientation: Right;Posterior  ?Staging: Stage 3 -  Full thickness tissue loss. Subcutaneous fat may be visible but bone, tendon or muscle are NOT exposed.  ?Wound Description (Comments):   ?Present on Admission: Yes  ?Dressing Type Gauze (Comment);Normal saline moist dressing;Santyl;Moist to dry 07/11/21 2233  ?   ?Pressure Injury 07/09/21 Foot Left Stage 2 -  Partial thickness loss of dermis presenting as a shallow open injury with a red, pink wound bed without slough. (Active)  ?07/09/21 0855  ?Location: Foot  ?Location Orientation: Left  ?Staging: Stage 2 -  Partial thickness loss of dermis presenting as a shallow open injury with a red, pink wound bed without slough.  ?Wound Description (Comments):   ?Present on Admission: Yes  ?Dressing Type Foam - Lift dressing to assess site every shift 07/12/21 0900  ?   ?Pressure Injury 07/09/21 Coccyx Medial Stage 1 -  Intact skin with non-blanchable redness of a localized area usually over a bony prominence. Pink (Active)  ?07/09/21 1009  ?Location: Coccyx  ?Location Orientation: Medial  ?Staging: Stage 1 -  Intact skin with non-blanchable redness of a localized area usually over a bony prominence.  ?  Wound Description (Comments): Pink  ?Present on Admission: Yes  ?Dressing Type Foam - Lift dressing to assess site every shift 07/11/21 2233  ? ? ? ?  ? ? ?DVT prophylaxis:  ?enoxaparin (LOVENOX) injection 40 mg  Start: 07/11/21 1600 ? ?Code Status: DNR ?Family Communication: None at bedside  ?Disposition Plan:  ?Status is: Inpatient ?Remains inpatient appropriate because: Possibly SNF placement versus hospice ? ? ?Antimicrobials:  ?Anti-infectives (From admission, onward)  ? ? Start     Dose/Rate Route Frequency Ordered Stop  ? 07/11/21 1215  sulfamethoxazole-trimethoprim (BACTRIM DS) 800-160 MG per tablet 1 tablet       ? 1 tablet Oral Every 12 hours 07/11/21 1124    ? 07/10/21 1700  cefTRIAXone (ROCEPHIN) 2 g in sodium chloride 0.9 % 100 mL IVPB  Status:  Discontinued       ? 2 g ?200 mL/hr over 30 Minutes Intravenous Every 24 hours 07/10/21 1134 07/11/21 1124  ? 07/10/21 0600  vancomycin (VANCOREADY) IVPB 1250 mg/250 mL  Status:  Discontinued       ? 1,250 mg ?166.7 mL/hr over 90 Minutes Intravenous Every 24 hours 07/09/21 0541 07/11/21 1124  ? 07/09/21 1700  ceFEPIme (MAXIPIME) 2 g in sodium chloride 0.9 % 100 mL IVPB  Status:  Discontinued       ? 2 g ?200 mL/hr over 30 Minutes Intravenous Every 12 hours 07/09/21 0538 07/10/21 1134  ? 07/09/21 0415  vancomycin (VANCOREADY) IVPB 1500 mg/300 mL       ? 1,500 mg ?150 mL/hr over 120 Minutes Intravenous  Once 07/09/21 0410 07/09/21 0800  ? 07/09/21 0415  ceFEPIme (MAXIPIME) 2 g in sodium chloride 0.9 % 100 mL IVPB       ? 2 g ?200 mL/hr over 30 Minutes Intravenous  Once 07/09/21 0410 07/09/21 3664  ? ?  ? ? ? ?Objective: ?Vitals:  ? 07/12/21 1323 07/12/21 1930 07/13/21 0553 07/13/21 0700  ?BP: (!) 132/55 94/60 122/72   ?Pulse: 100 (!) 104    ?Resp: 18 17 (!) 25 16  ?Temp: 100 ?F (37.8 ?C) 98.4 ?F (36.9 ?C) 98.3 ?F (36.8 ?C)   ?TempSrc: Oral Axillary Axillary   ?SpO2: 94%  94%   ?Weight:      ?Height:      ? ? ?Intake/Output Summary (Last 24 hours) at 07/13/2021 1057 ?Last data filed at 07/13/2021 0600 ?Gross per 24 hour  ?Intake 390.05 ml  ?Output 275 ml  ?Net 115.05 ml  ? ? ?Filed Weights  ? 07/08/21 2123  ?Weight: 69.9 kg  ? ?Examination: ?General exam: Appears calm and  comfortable, confused ? ?Data Reviewed: I have personally reviewed following labs and imaging studies ? ?CBC: ?Recent Labs  ?Lab 07/09/21 ?0045 07/10/21 ?4034  ?WBC 10.1 9.7  ?NEUTROABS 6.6  --   ?HGB 10.2* 9.1*  ?HCT 32.8* 29.7*  ?MCV 89.1 88.7  ?PLT 285 261  ? ? ?Basic Metabolic Panel: ?Recent Labs  ?Lab 07/09/21 ?0045 07/11/21 ?0345 07/12/21 ?0336  ?NA 131* 131* 133*  ?K 4.0 3.3* 3.6  ?CL 96* 99 102  ?CO2 '25 22 24  ' ?GLUCOSE 115* 106* 107*  ?BUN 17 20 28*  ?CREATININE 0.62 0.77 0.76  ?CALCIUM 8.5* 7.9* 8.1*  ?MG  --   --  2.1  ? ? ?GFR: ?Estimated Creatinine Clearance: 49.5 mL/min (by C-G formula based on SCr of 0.76 mg/dL). ?Liver Function Tests: ?Recent Labs  ?Lab 07/09/21 ?0045  ?AST 29  ?ALT 13  ?ALKPHOS 87  ?BILITOT  1.2  ?PROT 7.4  ?ALBUMIN 3.2*  ? ? ?No results for input(s): LIPASE, AMYLASE in the last 168 hours. ?No results for input(s): AMMONIA in the last 168 hours. ?Coagulation Profile: ?Recent Labs  ?Lab 07/09/21 ?5790  ?INR 1.5*  ? ? ?Cardiac Enzymes: ?Recent Labs  ?Lab 07/09/21 ?0045  ?CKTOTAL 57  ? ? ?BNP (last 3 results) ?No results for input(s): PROBNP in the last 8760 hours. ?HbA1C: ?No results for input(s): HGBA1C in the last 72 hours. ?CBG: ?No results for input(s): GLUCAP in the last 168 hours. ?Lipid Profile: ?No results for input(s): CHOL, HDL, LDLCALC, TRIG, CHOLHDL, LDLDIRECT in the last 72 hours. ?Thyroid Function Tests: ?No results for input(s): TSH, T4TOTAL, FREET4, T3FREE, THYROIDAB in the last 72 hours. ? ?Anemia Panel: ?Recent Labs  ?  07/11/21 ?0345  ?FERRITIN 309*  ?TIBC 167*  ?IRON 14*  ? ? ?Sepsis Labs: ?Recent Labs  ?Lab 07/09/21 ?3833 07/09/21 ?3832  ?LATICACIDVEN 2.3* 1.4  ? ? ? ?Recent Results (from the past 240 hour(s))  ?Blood culture (routine x 2)     Status: None (Preliminary result)  ? Collection Time: 07/09/21  5:11 AM  ? Specimen: BLOOD  ?Result Value Ref Range Status  ? Specimen Description   Final  ?  BLOOD BLOOD RIGHT FOREARM ?Performed at [facility], [place] [address], [place], [place] [address] ?  ? Special Requests   Final  ?  BOTTLES DRAWN AEROBIC ONLY Blood Culture results may not be optimal due to an inadequate volume of blood received in cultu

## 2021-07-13 NOTE — TOC Progression Note (Signed)
Transition of Care (TOC) - Progression Note  ? ? ?Patient Details  ?Name: Whitney Schmidt ?MRN: [id number] ?Date of Birth: [birth year] ? ?Transition of Care (TOC) CM/SW Contact  ?Prosperity Darrough Aris Lot, LCSW ?Phone Number: ?07/13/2021, 2:04 PM ? ?Clinical Narrative:    ? ?Contacted Dru with [facility]. They can admit pt over the weekend if needed. Pt would be admitting too Room PB1-126; [phone] is weekend contact#.  ? ?Expected Discharge Plan: Skilled Nursing Facility ?Barriers to Discharge: Continued Medical Work up ? ?Expected Discharge Plan and Services ?Expected Discharge Plan: Skilled Nursing Facility ?In-house Referral: Clinical Social Work ?  ?Post Acute Care Choice: Skilled Nursing Facility ?Living arrangements for the past 2 months: Skilled Nursing Facility ?                ?DME Arranged: N/A ?DME Agency: NA ?  ?  ?  ?  ?  ?  ?  ?  ? ? ?Social Determinants of Health (SDOH) Interventions ?  ? ?Readmission Risk Interventions ?   ? View : No data to display.  ?  ?  ?  ? ? ?

## 2021-07-13 NOTE — Progress Notes (Signed)
Attempted USG PIV start x 2 ,was unsuccessful. Patient's daughter requested not to start IV anymore as patient is agitated and demented. ?

## 2021-07-13 NOTE — TOC Progression Note (Incomplete)
Transition of Care (TOC) - Progression Note  ? ? ?Patient Details  ?Name: Madelena Risley ?MRN: [id number] ?Date of Birth: 03-05-1927 ? ?Transition of Care (TOC) CM/SW Contact  ?[place], LCSW ?Phone Number: ?07/13/2021, 3:58 PM ? ?Clinical Narrative:    ? ?CSW received call back from [facility] regarding previous rehab auth.  ? ?Expected Discharge Plan: [place] ?Barriers to Discharge: Continued Medical Work up ? ?Expected Discharge Plan and Services ?Expected Discharge Plan: [place] ?In-house Referral: Clinical Social Work ?  ?Post Acute Care Choice: [place] ?Living arrangements for the past 2 months: [place] ?                ?DME Arranged: N/A ?DME Agency: NA ?  ?  ?  ?  ?  ?  ?  ?  ? ? ?Social Determinants of Health (SDOH) Interventions ?  ? ?Readmission Risk Interventions ?   ? View : No data to display.  ?  ?  ?  ? ? ?

## 2021-07-13 NOTE — Progress Notes (Signed)
Called Shanon Brow to give him an update. SRP,RN ?

## 2021-07-13 NOTE — Progress Notes (Signed)
Pt anxious and confused. Alert to name and children name. Does not follow commands. Attempt to feed self, pt has decreased appetite and ate about 15-20% of lunch. Enc. Pt to eat , refused to eat. Will continue to work with pt./ SRP, RN ?

## 2021-07-13 NOTE — Evaluation (Signed)
Physical Therapy Evaluation ?Patient Details ?Name: Whitney Schmidt ?MRN: [id number] ?DOB: [birth year] ?Today's Date: 07/13/2021 ? ?History of Present Illness ? patient is a 88 year old female who presented with right hip and left shoulder pain. patient was found to have sepsis secondary to cellulitis of R heel and drug resistant E. coli UTI, subacute right femur fracturedementia, hypothyroidism, hearing difficulties, hypertension. Recent admission December 2022 for fall with R avulsion fracture of greater trochanter.  ?Clinical Impression ? Pt admitted with above diagnosis. +2 total assist for supine to sit, pt sat edge of bed for ~5 minutes, then +2 total assist to return to supine. Pt not able to follow commands due to advanced dementia. Long term care recommended.  Pt currently with functional limitations due to the deficits listed below (see PT Problem List). Pt will benefit from skilled PT to increase their independence and safety with mobility to allow discharge to the venue listed below.   ?   ?   ? ?Recommendations for follow up therapy are one component of a multi-disciplinary discharge planning process, led by the attending physician.  Recommendations may be updated based on patient status, additional functional criteria and insurance authorization. ? ?Follow Up Recommendations Long-term institutional care without follow-up therapy ? ?  ?Assistance Recommended at Discharge Frequent or constant Supervision/Assistance  ?Patient can return home with the following ? Two people to help with walking and/or transfers;A lot of help with bathing/dressing/bathroom;Assistance with cooking/housework;Direct supervision/assist for financial management;Direct supervision/assist for medications management;Assist for transportation;Help with stairs or ramp for entrance ? ?  ?Equipment Recommendations    ?Recommendations for Other Services ?    ?  ?Functional Status Assessment Patient has had a recent decline in their functional  status and demonstrates the ability to make significant improvements in function in a reasonable and predictable amount of time.  ? ?  ?Precautions / Restrictions Precautions ?Precautions: Fall ?Restrictions ?Weight Bearing Restrictions: No  ? ?  ? ?Mobility ? Bed Mobility ?Overal bed mobility: Needs Assistance ?Bed Mobility: Supine to Sit ?Rolling: Total assist, +2 for physical assistance ?  ?Supine to sit: Total assist, +2 for physical assistance ?  ?  ?General bed mobility comments: total assist for all mobility, pt unable to participate in a meaningful way due to severe confusion; pt sat edge of bed for ~5 minutes ?  ? ?Transfers ?  ?  ?  ?  ?  ?  ?  ?  ?  ?  ?  ? ?Ambulation/Gait ?  ?  ?  ?  ?  ?  ?  ?  ? ?Stairs ?  ?  ?  ?  ?  ? ?Wheelchair Mobility ?  ? ?Modified Rankin (Stroke Patients Only) ?  ? ?  ? ?Balance Overall balance assessment: Needs assistance, History of Falls ?Sitting-balance support: Feet supported, Single extremity supported ?Sitting balance-Leahy Scale: Fair ?  ?  ?  ?  ?  ?  ?  ?  ?  ?  ?  ?  ?  ?  ?  ?  ?   ? ? ? ?Pertinent Vitals/Pain Pain Assessment ?Breathing: normal ?Negative Vocalization: occasional moan/groan, low speech, negative/disapproving quality ?Facial Expression: sad, frightened, frown ?Body Language: tense, distressed pacing, fidgeting ?Consolability: unable to console, distract or reassure ?PAINAD Score: 5 ?Pain Location: pt unable to state ?Pain Descriptors / Indicators: Grimacing, Moaning ?Pain Intervention(s): Limited activity within patient's tolerance, Monitored during session, Premedicated before session  ? ? ?Home Living Family/patient expects to be discharged  to:: Skilled nursing facility ?  ?  ?  ?  ?  ?  ?  ?  ?  ?Additional Comments: pt unable to provide PLOF 2* advanced dementia  ?  ?Prior Function Prior Level of Function : Needs assist ?  ?  ?  ?  ?  ?  ?  ?ADLs Comments: pt unable to provide PLOF ?  ? ? ?Hand Dominance  ?   ? ?  ?Extremity/Trunk Assessment  ?  Upper Extremity Assessment ?Upper Extremity Assessment: Difficult to assess due to impaired cognition ?  ? ?Lower Extremity Assessment ?Lower Extremity Assessment: Difficult to assess due to impaired cognition;Generalized weakness ?  ? ?Cervical / Trunk Assessment ?Cervical / Trunk Assessment: Kyphotic  ?Communication  ? Communication: HOH  ?Cognition Arousal/Alertness: Awake/alert ?Behavior During Therapy: Agitated ?Overall Cognitive Status: No family/caregiver present to determine baseline cognitive functioning ?  ?  ?  ?  ?  ?  ?  ?  ?  ?  ?  ?  ?  ?  ?  ?  ?General Comments: pt not able to follow commands nor respond to questions, she became agitated with attempted bed mobility (grabbed therapist, stated "leave me alone!"), she does verbalize but her statements aren't related to current situation ?  ?  ? ?  ?General Comments   ? ?  ?Exercises    ? ?Assessment/Plan  ?  ?PT Assessment Patient needs continued PT services  ?PT Problem List Decreased activity tolerance;Decreased balance;Decreased mobility;Pain ? ?   ?  ?PT Treatment Interventions Therapeutic activities;Functional mobility training;Patient/family education   ? ?PT Goals (Current goals can be found in the Care Plan section)  ?Acute Rehab PT Goals ?PT Goal Formulation: Patient unable to participate in goal setting ?Time For Goal Achievement: 07/27/21 ?Potential to Achieve Goals: Fair ? ?  ?Frequency Min 2X/week ?  ? ? ?Co-evaluation   ?  ?  ?  ?  ? ? ?  ?AM-PAC PT "6 Clicks" Mobility  ?Outcome Measure Help needed turning from your back to your side while in a flat bed without using bedrails?: Total ?Help needed moving from lying on your back to sitting on the side of a flat bed without using bedrails?: Total ?Help needed moving to and from a bed to a chair (including a wheelchair)?: Total ?Help needed standing up from a chair using your arms (e.g., wheelchair or bedside chair)?: Total ?Help needed to walk in hospital room?: Total ?Help needed climbing  3-5 steps with a railing? : Total ?6 Click Score: 6 ? ?  ?End of Session   ?Activity Tolerance: Treatment limited secondary to agitation;Patient limited by pain ?Patient left: in bed;with bed alarm set;with call bell/phone within reach;with nursing/sitter in room ?Nurse Communication: Mobility status;Need for lift equipment ?PT Visit Diagnosis: Difficulty in walking, not elsewhere classified (R26.2);Pain;Other abnormalities of gait and mobility (R26.89) ?Pain - Right/Left: Right ?Pain - part of body: Hip ?  ? ?Time: [id number] ?PT Time Calculation (min) (ACUTE ONLY): 14 min ? ? ?Charges:   PT Evaluation ?$PT Eval Moderate Complexity: 1 Mod ?  ?  ?   ? ?Blondell Reveal Kistler PT 07/13/2021  ?Acute Rehabilitation Services ?Pager [phone] ?Office [phone] ? ? ?

## 2021-07-13 NOTE — Plan of Care (Signed)

## 2021-07-14 DIAGNOSIS — Z515 Encounter for palliative care: Secondary | ICD-10-CM

## 2021-07-14 LAB — CULTURE, BLOOD (ROUTINE X 2)
Culture: NO GROWTH
Culture: NO GROWTH
Special Requests: ADEQUATE

## 2021-07-14 MED ORDER — ALPRAZOLAM 0.25 MG PO TABS
0.2500 mg | ORAL_TABLET | Freq: Two times a day (BID) | ORAL | 0 refills | Status: AC | PRN
Start: 2021-07-14 — End: ?

## 2021-07-14 MED ORDER — SULFAMETHOXAZOLE-TRIMETHOPRIM 800-160 MG PO TABS
1.0000 | ORAL_TABLET | Freq: Two times a day (BID) | ORAL | 0 refills | Status: AC
Start: 2021-07-14 — End: 2021-07-15

## 2021-07-14 MED ORDER — OXYCODONE HCL 5 MG PO TABS: 5.0000 mg | ORAL_TABLET | Freq: Four times a day (QID) | ORAL | 0 refills | Status: AC | PRN

## 2021-07-14 MED ORDER — METOPROLOL TARTRATE 25 MG PO TABS: 12.5000 mg | ORAL_TABLET | Freq: Two times a day (BID) | ORAL | 0 refills | Status: AC

## 2021-07-14 NOTE — Discharge Summary (Signed)
Physician Discharge Summary  ?Whitney Schmidt NOI:[id number] DOB: 05-11-1926 DOA: 07/08/2021 ? ?PCP: Hennie Duos, MD ? ?Admit date: 07/08/2021 ?Discharge date: 07/14/2021 ? ?Admitted From: [facility] ?Disposition:  [place] with [facility]  ? ?Discharge Condition: Poor prognosis due to diminished oral intake ?CODE STATUS: DNR  ?Diet recommendation: Regular  ? ?Brief/Interim Summary: ?Whitney Schmidt is a 88 y.o. female with a history of dementia, hypothyroidism, hearing difficulties, hypertension. Patient presented secondary to right hip and left shoulder pain of unknown etiology. While admitted, she met sepsis criteria with concern for cellulitis. She was also found have developed atrial fibrillation with RVR. Empiric antibiotics initiated. Heparin IV initiated, which was transitioned to aspirin.  Hospitalization further complicated by dementia and decreased oral intake requiring IVF.  Palliative care involved. Patient was ultimately discharged back to her home setting facility with hospice support.   ? ?Discharge Diagnoses:  ?Principal Problem: ?  Sepsis ([place]) ?Active Problems: ?  Cellulitis ?  Dementia ([place]) ?  Hypertension ?  Hypothyroidism ?  Pressure injury of skin ?  UTI (urinary tract infection) ?  Paroxysmal atrial fibrillation ([facility]) ?  Trochanteric fracture of right femur ([place]) ?  Chronic anemia ?  Hospice care patient ? ? ? ?Sepsis secondary to presumed wound/cellulitis of the right heel and drug-resistant E. coli UTI ?-Discussed with pharmacy, de-escalate vancomycin/ceftriaxone to Bactrim, end date 3/26 to complete total 7-day course of antibiotics ?-WOC RN for wound care of right heel ?-Blood cultures are negative ?  ?Subacute right femur fracture ?-History of fall in December 2022 which revealed comminuted displaced fracture of the greater trochanter December 2022.  Case discussed with Dr. Lucia Gaskins on-call orthopedic surgery who reviewed images and agreed that this was a nonoperative case and  needed to be treated with weightbearing as tolerated.  She was returned back to her skilled nursing facility at that time. ?-MRI pelvis revealed subacute comminuted fracture of the right greater trochanter ?-PT OT consulted but patient has been refusing rehab services ?  ?Paroxysmal A-fib, new onset ?-Continue aspirin, lopressor  ?  ?Dementia ?-Aricept, Seroquel ? ?Hypothyroidism ?-Synthroid ?  ?Goals of care ?-Discharged with hospice support ?  ? ?In agreement with assessment of the pressure ulcer as below:  ?Pressure Injury 07/09/21 Heel Right;Posterior Stage 3 -  Full thickness tissue loss. Subcutaneous fat may be visible but bone, tendon or muscle are NOT exposed. (Active)  ?07/09/21 0855  ?Location: Heel  ?Location Orientation: Right;Posterior  ?Staging: Stage 3 -  Full thickness tissue loss. Subcutaneous fat may be visible but bone, tendon or muscle are NOT exposed.  ?Wound Description (Comments):   ?Present on Admission: Yes  ?Dressing Type Gauze (Comment);Moist to dry;Santyl 07/13/21 2050  ?   ?Pressure Injury 07/09/21 Foot Left Stage 2 -  Partial thickness loss of dermis presenting as a shallow open injury with a red, pink wound bed without slough. (Active)  ?07/09/21 0855  ?Location: Foot  ?Location Orientation: Left  ?Staging: Stage 2 -  Partial thickness loss of dermis presenting as a shallow open injury with a red, pink wound bed without slough.  ?Wound Description (Comments):   ?Present on Admission: Yes  ?Dressing Type Foam - Lift dressing to assess site every shift 07/12/21 0900  ?   ?Pressure Injury 07/09/21 Coccyx Medial Stage 1 -  Intact skin with non-blanchable redness of a localized area usually over a bony prominence. Pink (Active)  ?07/09/21 1009  ?Location: Coccyx  ?Location Orientation: Medial  ?Staging: Stage 1 -  Intact skin with non-blanchable redness of a localized area usually over a bony prominence.  ?Wound Description (Comments): Pink  ?Present on Admission: Yes  ?Dressing Type Foam -  Lift dressing to assess site every shift 07/13/21 2050  ? ? ?  ? ?Discharge Instructions ? ?Discharge Instructions   ? ? Diet - low sodium heart healthy   Complete by: As directed ?  ? Discharge wound care:   Complete by: As directed ?  ? 1. Clean right heel wound with saline, pat dry ?2. Apply 1/4" thick layer of Santyl, top with 2x2 gauze moist with saline, not soaked\ ?3. Top with dry dressing, secure with kerlix ?4. Offload with Prevalon boots as much as possible. ?5. Silicone foam to the left lateral foot wound  ? Increase activity slowly   Complete by: As directed ?  ? ?  ? ?Allergies as of 07/14/2021   ? ?   Reactions  ? Nitrofurantoin   ? ?  ? ?  ?Medication List  ?  ? ?STOP taking these medications   ? ?amLODipine 2.5 MG tablet ?Commonly known as: NORVASC ?  ? ?  ? ?TAKE these medications   ? ?acetaminophen 325 MG tablet ?Commonly known as: TYLENOL ?Take 650 mg by mouth every 6 (six) hours as needed for moderate pain or headache. ?  ?ALPRAZolam 0.25 MG tablet ?Commonly known as: Duanne Moron ?Take 1 tablet (0.25 mg total) by mouth 2 (two) times daily as needed for anxiety. ?  ?ARGINAID PO ?Take 4.5 g by mouth 2 (two) times daily. Mix 1 packet with fluids prior to giving ?  ?aspirin 325 MG tablet ?Take 325 mg by mouth daily. ?  ?donepezil 10 MG tablet ?Commonly known as: ARICEPT ?Take 1 tablet (10 mg total) by mouth at bedtime. ?  ?levothyroxine 50 MCG tablet ?Commonly known as: Synthroid ?Take 1 tablet (50 mcg total) by mouth daily before breakfast. Take 1 tablet (50 mcg total) by moth daily at 0600 ?  ?Lumigan 0.01 % Soln ?Generic drug: bimatoprost ?1 drop at bedtime. ?  ?metoprolol tartrate 25 MG tablet ?Commonly known as: LOPRESSOR ?Take 0.5 tablets (12.5 mg total) by mouth 2 (two) times daily. ?  ?multivitamin with minerals tablet ?Take 1 tablet by mouth daily. Decubi Vite 436mg-50mg-500mg ?  ?oxyCODONE 5 MG immediate release tablet ?Commonly known as: Oxy IR/ROXICODONE ?Take 1 tablet (5 mg total) by mouth  every 6 (six) hours as needed for severe pain or breakthrough pain (prior to PT OT). ?  ?QUEtiapine 50 MG tablet ?Commonly known as: SEROQUEL ?Take 1 tablet (50 mg total) by mouth at bedtime. ?  ?sulfamethoxazole-trimethoprim 800-160 MG tablet ?Commonly known as: BACTRIM DS ?Take 1 tablet by mouth every 12 (twelve) hours for 1 day. ?  ?vitamin B-12 250 MCG tablet ?Commonly known as: CYANOCOBALAMIN ?Take 250 mcg by mouth daily. ?  ? ?  ? ?  ?  ? ? ?  ?Discharge Care Instructions  ?(From admission, onward)  ?  ? ? ?  ? ?  Start     Ordered  ? 07/14/21 0000  Discharge wound care:       ?Comments: 1. Clean right heel wound with saline, pat dry ?2. Apply 1/4" thick layer of Santyl, top with 2x2 gauze moist with saline, not soaked\ ?3. Top with dry dressing, secure with kerlix ?4. Offload with Prevalon boots as much as possible. ?5. Silicone foam to the left lateral foot wound  ? 07/14/21 1018  ? ?  ?  ? ?  ? ? ?  Allergies  ?Allergen Reactions  ? Nitrofurantoin   ? ? ? ? ?Procedures/Studies: ?DG Wrist 2 Views Left ? ?Result Date: 07/09/2021 ?CLINICAL DATA:  88 year old female status post fall. EXAM: LEFT WRIST - 2 VIEW COMPARISON:  None. FINDINGS: Two oblique views of the left wrist. No definite distal radius or ulna fracture. No definite carpal dislocation. Moderate to severe degeneration at the 1st [place] and along the radial carpal row with joint space loss, bulky osteophytosis and sclerosis. No metacarpal fracture identified. IMPRESSION: Suboptimal exam consisting of two oblique views. Osteoarthritis with no definite acute left wrist fracture. Electronically Signed   By: Genevie Ann M.D.   On: 07/09/2021 05:59  ? ?DG Wrist Complete Right ? ?Result Date: 07/11/2021 ?CLINICAL DATA:  Right wrist pain.  Recent fall. EXAM: RIGHT WRIST - COMPLETE 3+ VIEW COMPARISON:  Right wrist x-rays dated July 09, 2021. FINDINGS: No definite acute fracture. No dislocation. Moderate to severe first CMC joint and distal radioulnar joint  osteoarthritis. Mild radiocarpal and midcarpal osteoarthritis. Osteopenia. Mild diffuse soft tissue swelling. IMPRESSION: 1. No definite acute osseous abnormality. No change from prior. If there is continued clinica

## 2021-07-14 NOTE — TOC Progression Note (Addendum)
Transition of Care (TOC) - Progression Note  ? ? ?Patient Details  ?Name: Whitney Schmidt ?MRN: [id number] ?Date of Birth: 03/12/1927 ? ?Transition of Care (TOC) CM/SW Contact  ?Tawanna Cooler, RN ?Phone Number: ?07/14/2021, 9:58 AM ? ?Clinical Narrative:    ? ?Spoke with patient's son, Whitney Schmidt, about hospice choice.  Whitney Schmidt is agreeable to [facility].  Called the weekend number and received a return call from [facility], who will start working on getting patient on service.     ? ?[employer] at [phone] and spoke to [employer].  He does not have access to the computer, so we will need to fax the d/c summary and any orders to [facility] at [phone].   ? ?Addendum: Spoke with hospice rep Whitney Schmidt.  She states she spoke with the son, Whitney Schmidt, letting him know that they will not be able to admit patient onto hospice services until Monday, but patient can go back to [facility] until then.  Son reluctantly agreed.   ?Spoke with rep, Whitney Schmidt, at [employer].  CM faxing d/c summary, they will review and let CM know if any questions prior to transfer.  ? ?Addendum: Per Whitney Schmidt, everything looks good.  Requests a 4 pm hospital pick up.  Patient going to room PB1-126.  Number to call report is [phone].   ?EMS called and pick up time scheduled for 4 pm per [facility] request.  ?CM called son Whitney Schmidt to let him know.  ? ? ?Expected Discharge Plan: [place] ?Barriers to Discharge: Continued Medical Work up ? ?Expected Discharge Plan and Services ?Expected Discharge Plan: [place] ?In-house Referral: Clinical Social Work ?  ?Post Acute Care Choice: [place] ?Living arrangements for the past 2 months: [place] ?                ?DME Arranged: N/A ?DME Agency: NA ?  ?  ? ? ?

## 2021-07-14 NOTE — Progress Notes (Signed)
Palliative: ? ?HPI: 88 y.o. female  with past medical history of dementia, hypertension, hypothyroidism, anxiety, hearing loss admitted on 07/08/2021 from [facility] ALF with pain to right hip and left shoulder with no reported falls. Diagnosed with sepsis likely from RLE cellulitis and right heel ulcer with new atrial fibrillation RVR.  ? ?I saw and examined Ms. Taher today.  She was sitting in bed in no distress at time my encounter.  Discussed case with her bedside RN and she reports she has had around 15% of lunch today. ? ?I called and was able to reach patient's son, Katarina Riebe. ? ?Onalee Hua and I had a long discussion regarding Ms. Brandon's current situation and continued decline in her nutrition, cognition, and functional status with progression of her dementia over the last several months to years.  We discussed her care plan this hospitalization and options for her care moving forward. ? ?Concepts specific to code status, care plan this hospitalization, and rehospitalization discussed.  We discussed difference between a aggressive medical intervention path and a palliative, comfort focused care path.  Values and goals of care important to patient and family were attempted to be elicited. ?  ?Concept of hospice reviewed and Onalee Hua is a agreement that this would be best care plan for his mother moving forward.  We discussed that realistically we have done all the medical interventions we can to modify her situation and is going to be a matter of if she eats and drinks enough to maintain herself.  Discussed that this may be best attempted in familiar environment of [facility] as being in the hospital can lead to continued confusion.  We discussed recommendation to consider transition back to [facility] with hospice support.  Onalee Hua reports that he feels this would likely be our best plan moving forward. ?  ?All questions/concerns addressed. Emotional support provided.  Will update Dr. Alvino Chapel and TOC.   ? ?Exam: Alert and confused but no significant distress. No distress.  Restless and reports pain with movement in bed. HR regular rate. Breathing regular, unlabored. Abd soft. Generalized weakness.  ? ?Plan: ?-DNR/DNI ?-Her son would like to work to transition her back to [facility] where she is a long-term resident with hospice support.  He understands severity of her situation and that her nutrition and hydration moving forward are going to drive her prognosis.  If she picks up and begins to eat and drink more when she transitions back to more familiar environment at [facility], plan will be to continue with current care plan and continue to support her with hospice with an expected prognosis of less than 6 months.  If she does not eat and drink enough to maintain herself, plan will still be hospice support but he understands prognosis will be much shorter timeframe with a likely prognosis of weeks at best. ? ?60 min ? ?Romie Minus, MD ?[facility] Palliative Medicine Team ?[phone] ? ? ? ?Greater than 50%  of this time was spent counseling and coordinating care related to the above assessment and plan   ?

## 2021-07-14 NOTE — Progress Notes (Signed)
?   07/14/21 1731  ?AVS Discharge Documentation  ?AVS Discharge Instructions Including Medications Provided to patient/caregiver  ?Name of Person Receiving AVS Discharge Instructions Including Medications RP, LPN [facility]  ?Name of Clinician That Reviewed AVS Discharge Instructions Including Medications Luanna Salk RN  ? ? ?

## 2021-07-14 NOTE — Progress Notes (Signed)
Around 2045 during the night, attempted to replace IV x2 --unsuccessful. Pt became agitated and requested for medical team to stop attempts. Pt daughter was at bedside and also requested no more attempts to place an IV.  ? ?Attempts were stopped per pt and pt daughter request. Pt was given scheduled meds along with a pain pill. Pt daughter assisted with giving pt medications  ?

## 2021-07-14 NOTE — Progress Notes (Signed)
[facility] arrived to transport pt to [facility]. ?Pt daughter contacted and notified of pt discharge.  ? ?Pt denies pain but is upset about not seeing her family and being alone. Pt was relieved knowing that her family was contacted and knows about her leaving [facility] to return to [facility]. RN stayed with pt until discharged safely from room via stretcher.  ? ?All personal belongings sent with pt and VSS prior to discharge ?

## 2021-07-16 NOTE — TOC Progression Note (Signed)
Transition of Care (TOC) - Progression Note  ? ? ?Patient Details  ?Name: Whitney Schmidt ?MRN: [id number] ?Date of Birth: 1926/09/12 ? ?Transition of Care (TOC) CM/SW Contact  ?Jhony Antrim Aris Lot, LCSW ?Phone Number: ?07/16/2021, 12:19 PM ? ?Clinical Narrative:    ? ?07/16/21 1219: CSW received call from pt's insurance informing that medicare request for SNF(for rehab) was denied. Appeal offered for family. It appears family has opted for pt to return to SNF with hospice instead of rehab. Appeal not needed.  ? ?Expected Discharge Plan: Skilled Nursing Facility ?Barriers to Discharge: No Barriers Identified ? ?Expected Discharge Plan and Services ?Expected Discharge Plan: Skilled Nursing Facility ?In-house Referral: Clinical Social Work ?  ?Post Acute Care Choice: Skilled Nursing Facility ?Living arrangements for the past 2 months: Skilled Nursing Facility ?Expected Discharge Date: 07/14/21               ?DME Arranged: N/A ?DME Agency: NA ?  ?  ?  ?  ?  ?  ?  ?  ? ? ?Social Determinants of Health (SDOH) Interventions ?  ? ?Readmission Risk Interventions ?   ? View : No data to display.  ?  ?  ?  ? ? ?

## 2021-07-17 LAB — URINE CULTURE: Culture: >=100000 — AB

## 2021-08-20 DEATH — deceased

## 2022-10-02 IMAGING — CT CT PELVIS W/O CM
2 of 4 series · 14 of 46 positions shown, 16 images · non-contrast
Comparison: Right hip CT 04/13/2021.

CLINICAL DATA: [AGE] female status post fall.  Tailbone pain.

EXAM:
CT PELVIS WITHOUT CONTRAST
TECHNIQUE: Multidetector CT imaging of the pelvis was performed following the
standard protocol without intravenous contrast.
RADIATION DOSE REDUCTION: This exam was performed according to the
departmental dose-optimization program which includes automated
exposure control, adjustment of the mA and/or kV according to
patient size and/or use of iterative reconstruction technique.

[Series 2: axial st · axial · 0.98mm/px · z∈[-363,-138]mm · 11 of 53 slices shown, 13 images]
[im 4/53  soft-tissue]
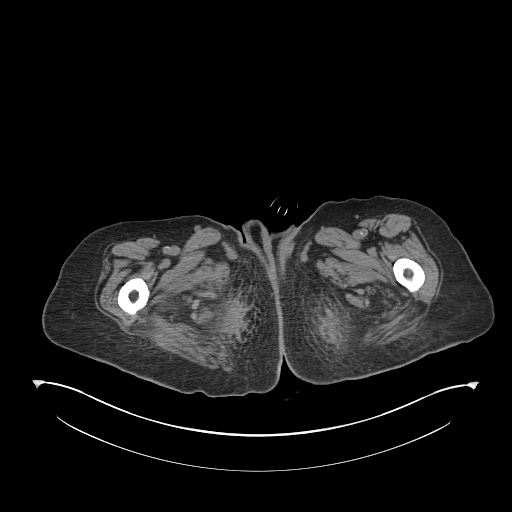
[im 4/53  bone]
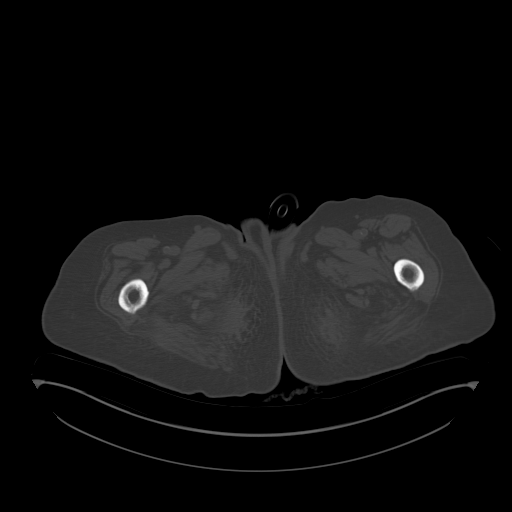
[im 9/53  soft-tissue]
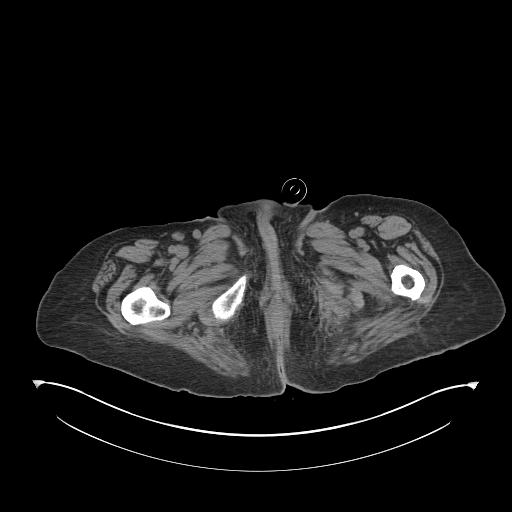
[im 12/53  soft-tissue]
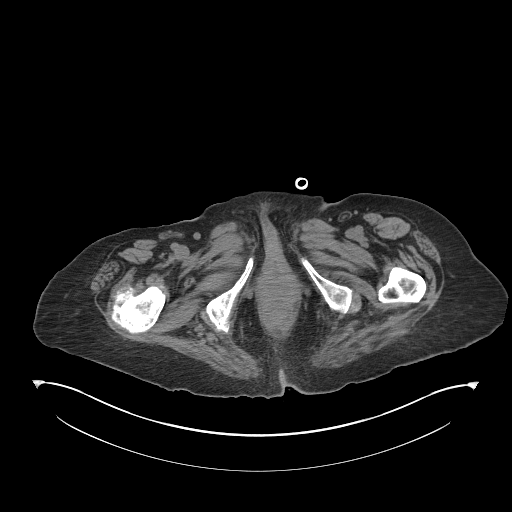
[im 17/53  soft-tissue]
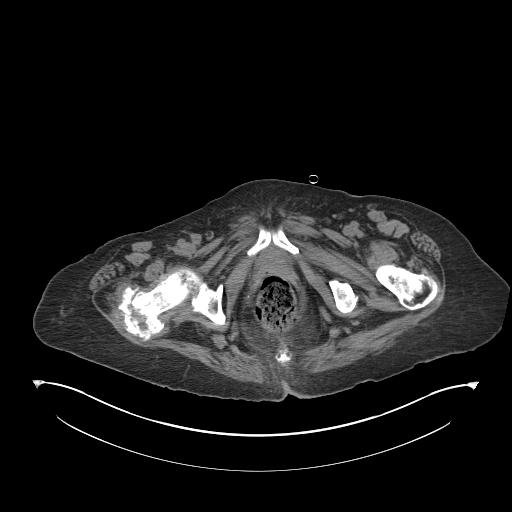
[im 22/53  soft-tissue]
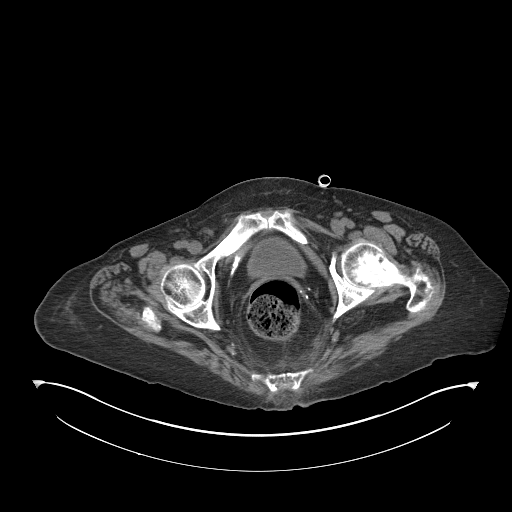
[im 27/53  soft-tissue]
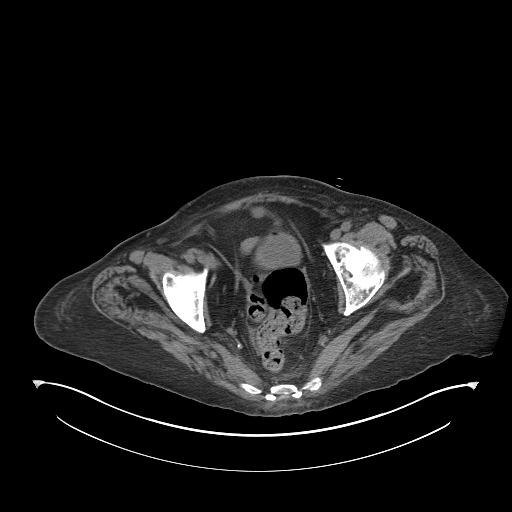
[im 31/53  soft-tissue]
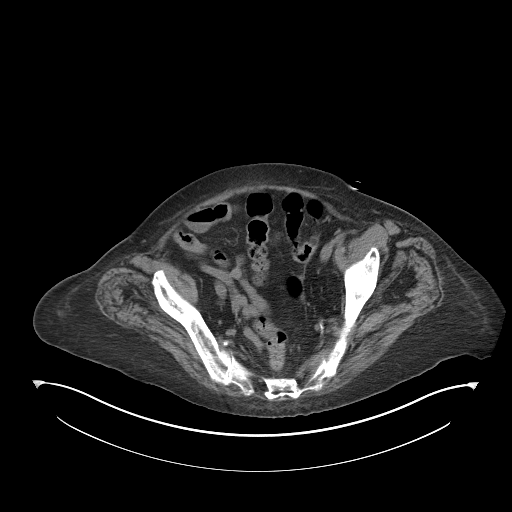
[im 36/53  soft-tissue]
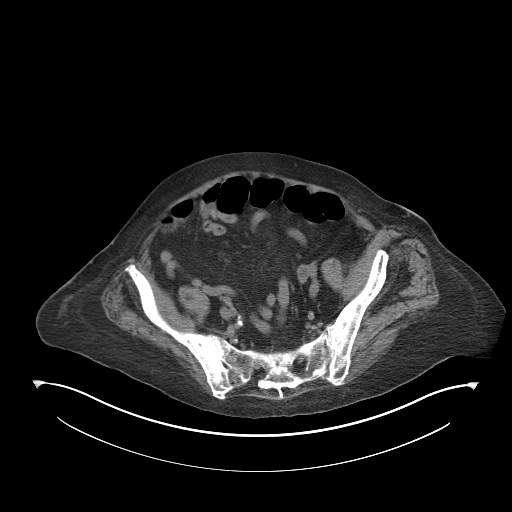
[im 41/53  soft-tissue]
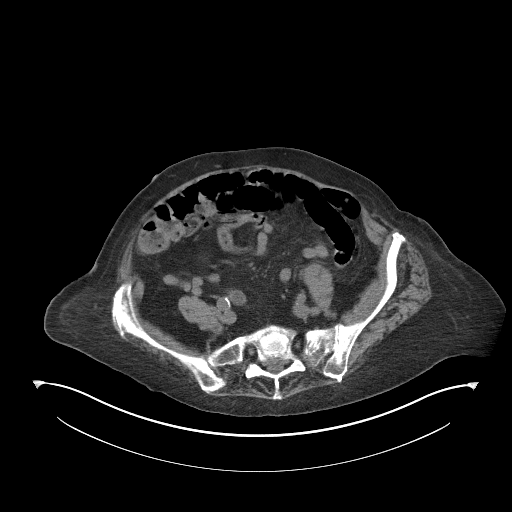
[im 41/53  bone]
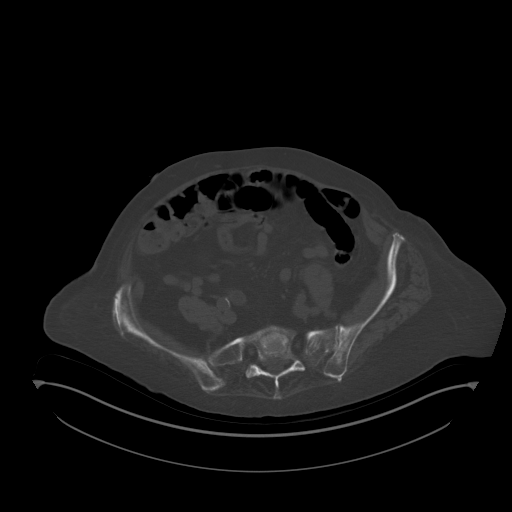
[im 44/53  soft-tissue]
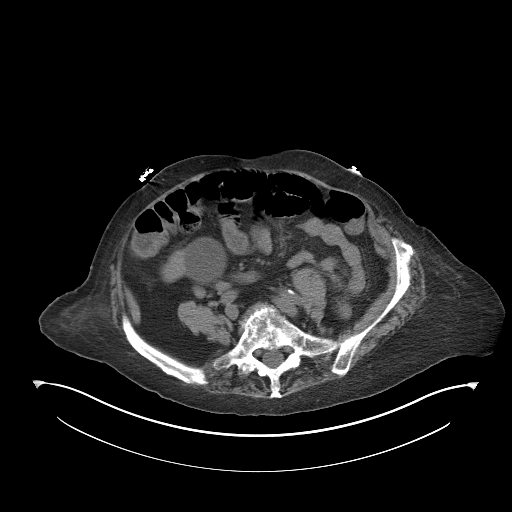
[im 49/53  soft-tissue]
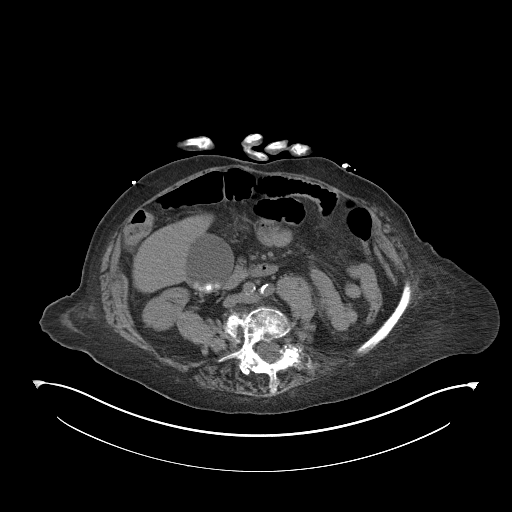

[Series 8: coronal st · coronal · 0.55mm/px · 3 of 142 slices shown]
[im 29/142  soft-tissue]
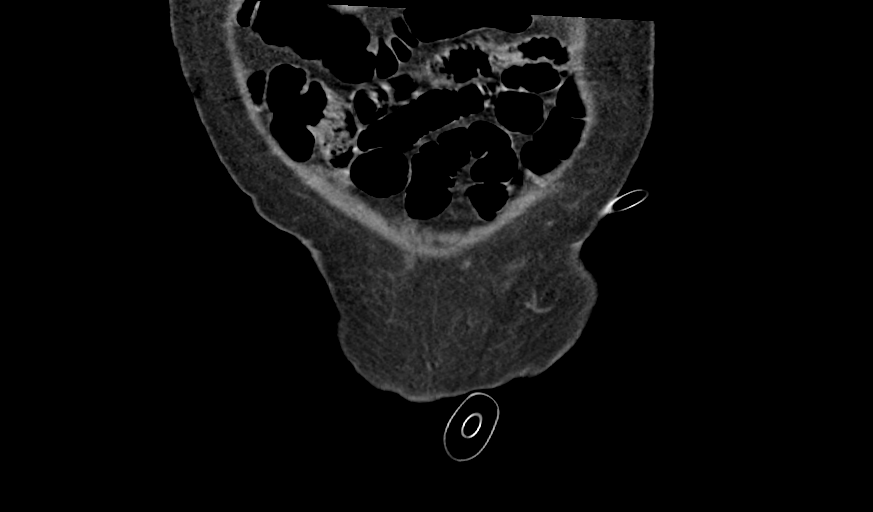
[im 57/142  soft-tissue]
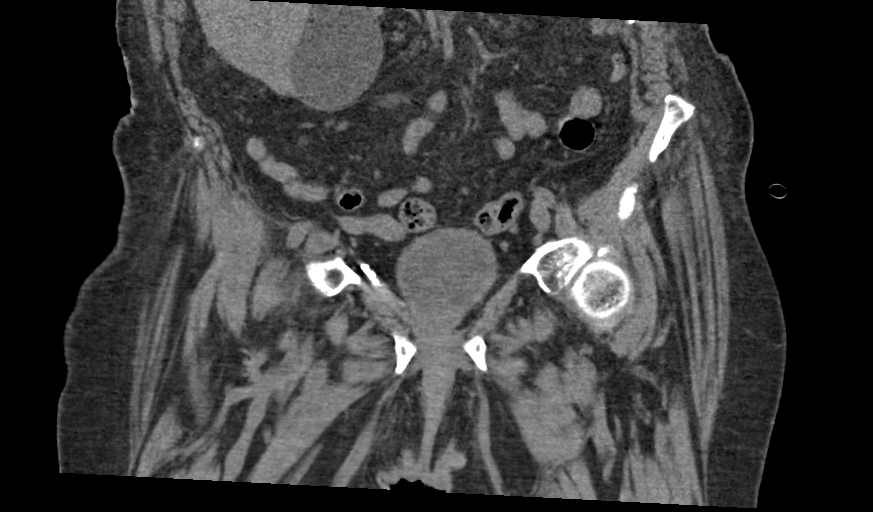
[im 85/142  soft-tissue]
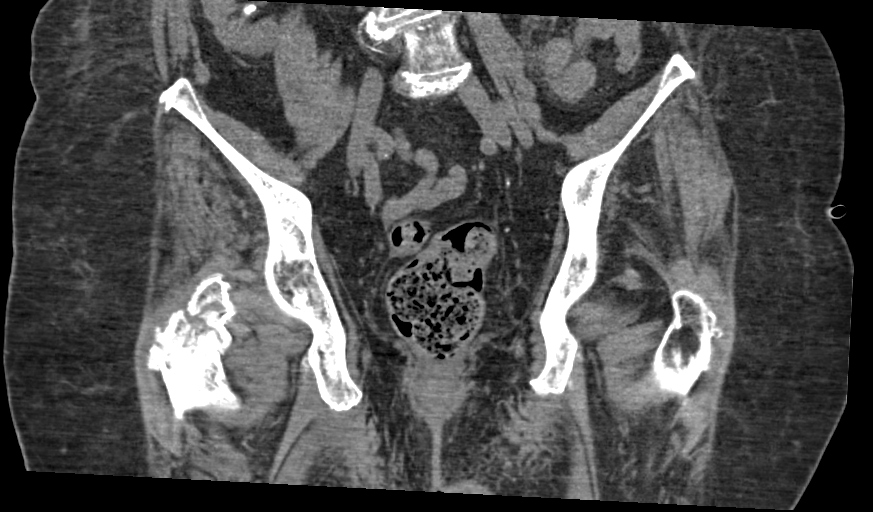

[14 of 46 positions shown; findings below may reference images not displayed]

FINDINGS: Urinary Tract: Right nephrolithiasis. Right kidney is nonobstructed.
Left kidney is not visible. Small volume of gas within the urinary
bladder which is fairly decompressed. Pelvic phleboliths. But no
convincing hydroureter or distal ureteral calculus.

Bowel: Nondilated large and small bowel. Mild to moderate volume of
retained stool in the rectum. No discrete bowel inflammation.

Vascular/Lymphatic: Aortoiliac calcified atherosclerosis. Vascular
patency is not evaluated in the absence of IV contrast. No
lymphadenopathy.

Reproductive: Absent uterus. Left ovary is within normal limits.
Right ovary is diminutive or absent.

Other: No pelvic free fluid, but there is mild to moderate lower
presacral and pre coccygeal stranding/edema (series 2, images 29-35.

Cholelithiasis. Visible gallbladder appears mildly distended but
without definite pericholecystic inflammation.

Musculoskeletal: Partially visible scoliosis in the lower spine.
There is lower lumbar posterior element ankylosis including through
L5-S1.

Diffuse osteopenia. Heterogeneity in the right sacral ala appear
stable from last year. No acute sacral ala fracture identified. The
central sacral vertebral bodies also appear intact and aligned on
the sagittal view. There is no coccygeal displacement.

Iliac bones, acetabula and pubic rami appear intact. Proximal left
femur appears intact.

Comminution of the right femur greater trochanter appears to be
chronic and not significantly changed since [REDACTED] other than new
periosteal new bone formation.

No fracture of the proximal left femur is evident.

There does appear to be mild to moderate soft tissue stranding
overlying the lower sacrum and coccyx on series 2, image 29. No
decubitus wound.
IMPRESSION: 1. Osteopenia. Soft tissue inflammation about the lower sacrum and
coccyx which appear nondisplaced. Difficult to exclude nondisplaced
lower sacral or coccygeal fracture. No superimposed sacral ala,
pelvis, or proximal left femur fracture is evident. Noncontrast
Pelvis MRI would be most sensitive for nondisplaced fracture.

2. Chronic comminution of the right femur greater trochanter with
mild periosteal new bone formation since [DATE]. Small volume of gas within the urinary bladder raising the
possibility of gas-forming UTI unless explained by recent
catheterization.

4. Cholelithiasis, right nephrolithiasis, Aortic Atherosclerosis
([id number]-UR5.5).

## 2022-10-04 IMAGING — DX DG WRIST COMPLETE 3+V*R*
3 series · 3 of 3 positions shown · non-contrast
Comparison: Right wrist x-rays dated July 09, 2021.

CLINICAL DATA: Right wrist pain.  Recent fall.

EXAM:
RIGHT WRIST - COMPLETE 3+ VIEW

[wrist ap]
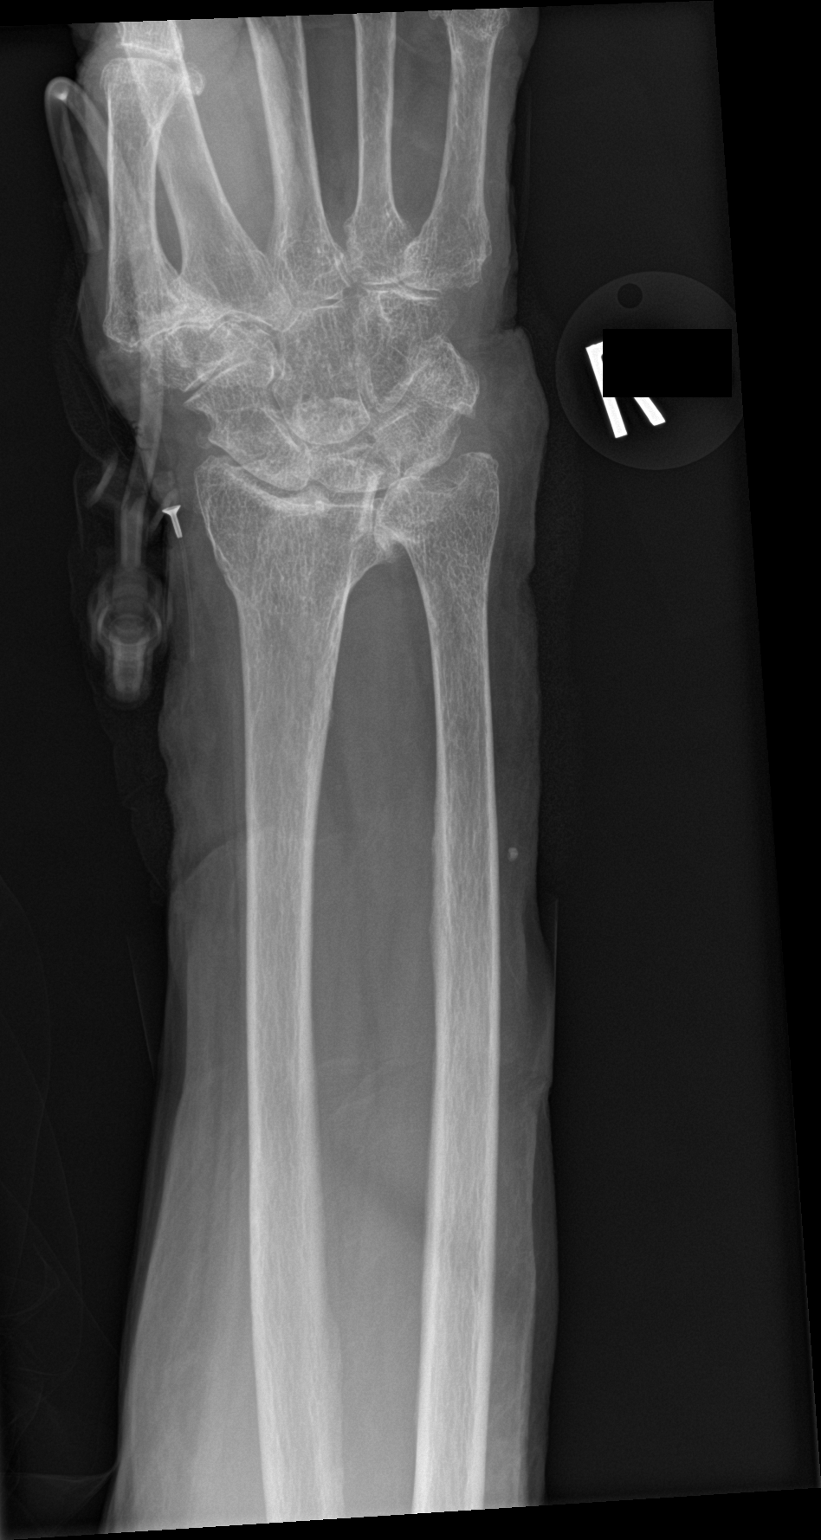

[wrist obl]
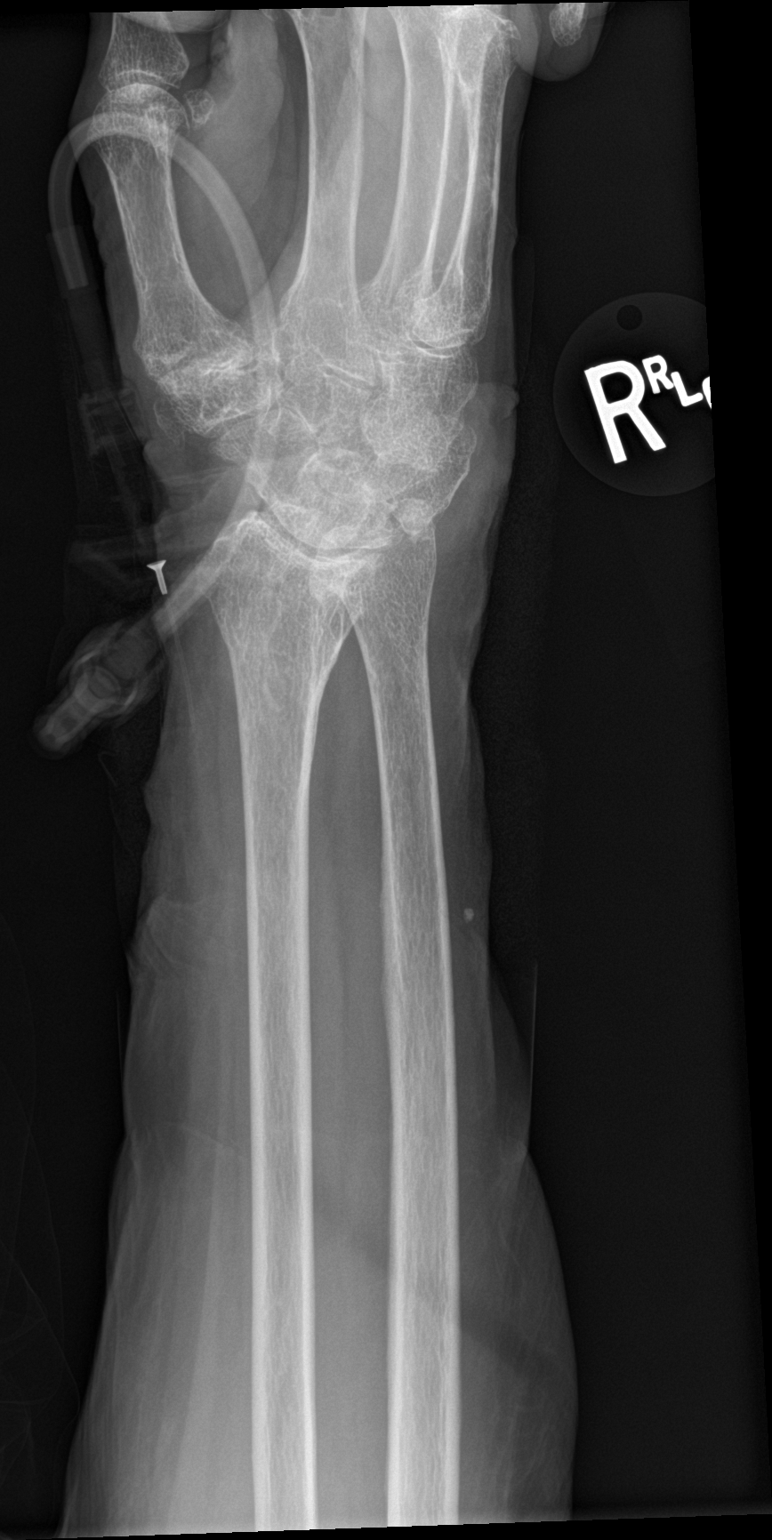

[wrist lat]
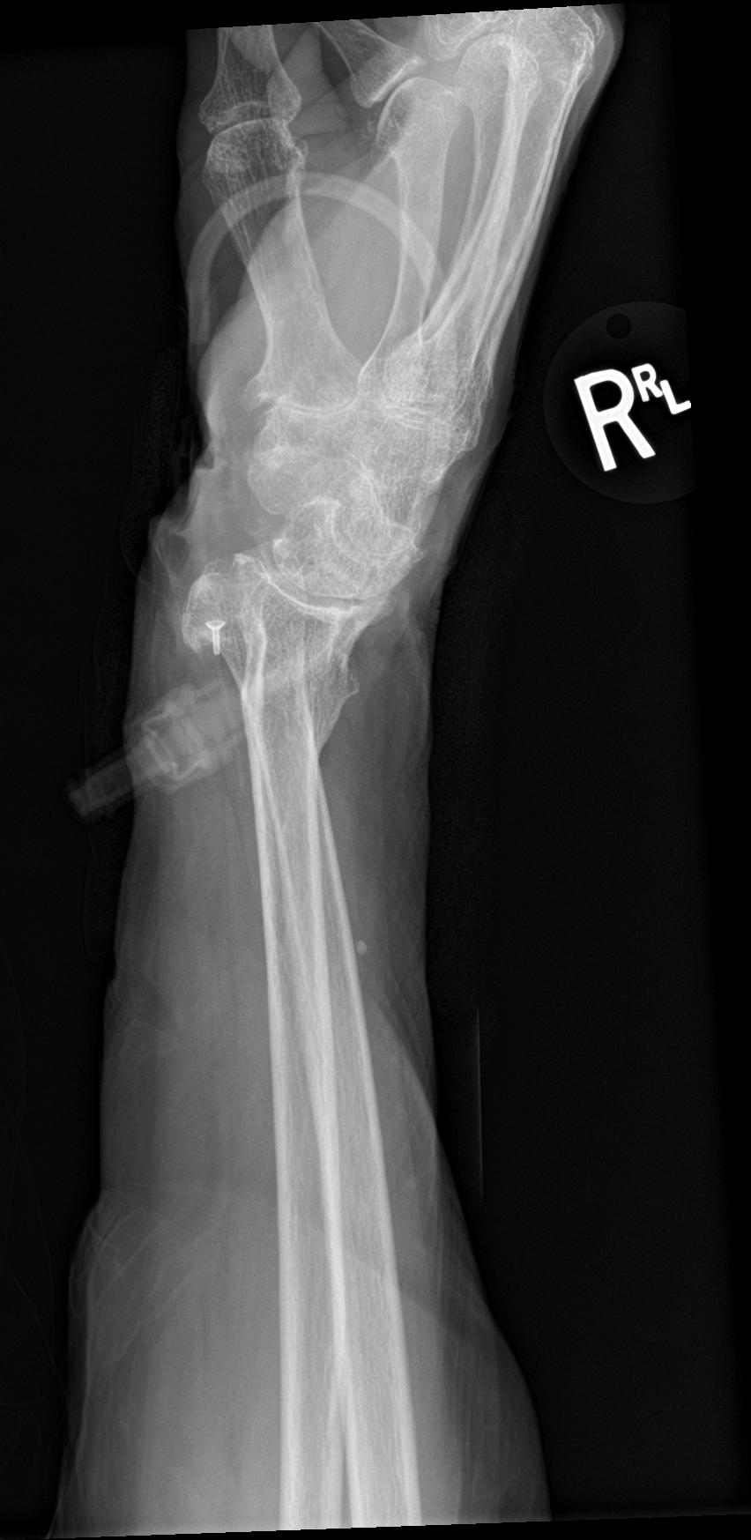

[3 of 3 positions shown; findings below may reference images not displayed]

FINDINGS: No definite acute fracture. No dislocation. Moderate to severe first
CMC joint and distal radioulnar joint osteoarthritis. Mild
radiocarpal and midcarpal osteoarthritis. Osteopenia. Mild diffuse
soft tissue swelling.
IMPRESSION: 1. No definite acute osseous abnormality. No change from prior. If
there is continued clinical concern for fracture, recommend
non-contrast CT for further evaluation.
